# Patient Record
Sex: Female | Born: 1955 | Race: White | Hispanic: No | Marital: Married | Smoking: Never smoker
Health system: Southern US, Community
[De-identification: ages and names within clinical notes are randomized; demographics above are authoritative.]

## PROBLEM LIST (undated history)

## (undated) DIAGNOSIS — F32A Depression, unspecified: Secondary | ICD-10-CM

## (undated) DIAGNOSIS — Z9221 Personal history of antineoplastic chemotherapy: Secondary | ICD-10-CM

## (undated) DIAGNOSIS — E785 Hyperlipidemia, unspecified: Secondary | ICD-10-CM

## (undated) DIAGNOSIS — C50919 Malignant neoplasm of unspecified site of unspecified female breast: Secondary | ICD-10-CM

## (undated) DIAGNOSIS — Z9889 Other specified postprocedural states: Secondary | ICD-10-CM

## (undated) DIAGNOSIS — Z923 Personal history of irradiation: Secondary | ICD-10-CM

## (undated) DIAGNOSIS — I10 Essential (primary) hypertension: Secondary | ICD-10-CM

## (undated) DIAGNOSIS — F419 Anxiety disorder, unspecified: Secondary | ICD-10-CM

## (undated) HISTORY — DX: Essential (primary) hypertension: I10

## (undated) HISTORY — DX: Malignant neoplasm of unspecified site of unspecified female breast: C50.919

## (undated) HISTORY — PX: WISDOM TOOTH EXTRACTION: SHX21

## (undated) HISTORY — DX: Other specified postprocedural states: Z98.890

## (undated) HISTORY — DX: Hyperlipidemia, unspecified: E78.5

## (undated) HISTORY — PX: BREAST BIOPSY: SHX20

---

## 2001-08-22 HISTORY — PX: AUGMENTATION MAMMAPLASTY: SUR837

## 2001-08-22 HISTORY — PX: BREAST ENHANCEMENT SURGERY: SHX7

## 2002-12-16 ENCOUNTER — Other Ambulatory Visit: Admission: RE | Admit: 2002-12-16 | Discharge: 2002-12-16 | Payer: Self-pay | Admitting: Obstetrics and Gynecology

## 2004-02-24 ENCOUNTER — Other Ambulatory Visit: Admission: RE | Admit: 2004-02-24 | Discharge: 2004-02-24 | Payer: Self-pay | Admitting: Obstetrics and Gynecology

## 2004-05-20 ENCOUNTER — Encounter: Admission: RE | Admit: 2004-05-20 | Discharge: 2004-05-20 | Payer: Self-pay | Admitting: Obstetrics and Gynecology

## 2005-08-24 ENCOUNTER — Encounter: Admission: RE | Admit: 2005-08-24 | Discharge: 2005-08-24 | Payer: Self-pay | Admitting: Obstetrics and Gynecology

## 2005-09-14 ENCOUNTER — Encounter: Admission: RE | Admit: 2005-09-14 | Discharge: 2005-09-14 | Payer: Self-pay | Admitting: Obstetrics and Gynecology

## 2005-09-19 ENCOUNTER — Encounter (INDEPENDENT_AMBULATORY_CARE_PROVIDER_SITE_OTHER): Payer: Self-pay | Admitting: Specialist

## 2005-09-19 ENCOUNTER — Encounter: Admission: RE | Admit: 2005-09-19 | Discharge: 2005-09-19 | Payer: Self-pay | Admitting: Obstetrics and Gynecology

## 2005-09-20 ENCOUNTER — Other Ambulatory Visit: Admission: RE | Admit: 2005-09-20 | Discharge: 2005-09-20 | Payer: Self-pay | Admitting: Obstetrics & Gynecology

## 2005-09-22 DIAGNOSIS — Z9889 Other specified postprocedural states: Secondary | ICD-10-CM

## 2005-09-22 HISTORY — DX: Other specified postprocedural states: Z98.890

## 2005-09-22 HISTORY — PX: BREAST BIOPSY: SHX20

## 2005-10-11 ENCOUNTER — Encounter: Admission: RE | Admit: 2005-10-11 | Discharge: 2005-10-11 | Payer: Self-pay | Admitting: General Surgery

## 2005-10-11 ENCOUNTER — Ambulatory Visit (HOSPITAL_BASED_OUTPATIENT_CLINIC_OR_DEPARTMENT_OTHER): Admission: RE | Admit: 2005-10-11 | Discharge: 2005-10-11 | Payer: Self-pay | Admitting: General Surgery

## 2005-10-11 ENCOUNTER — Encounter (INDEPENDENT_AMBULATORY_CARE_PROVIDER_SITE_OTHER): Payer: Self-pay | Admitting: *Deleted

## 2006-08-22 DIAGNOSIS — Z923 Personal history of irradiation: Secondary | ICD-10-CM

## 2006-08-22 DIAGNOSIS — Z9221 Personal history of antineoplastic chemotherapy: Secondary | ICD-10-CM

## 2006-08-22 DIAGNOSIS — C50919 Malignant neoplasm of unspecified site of unspecified female breast: Secondary | ICD-10-CM

## 2006-08-22 HISTORY — DX: Personal history of irradiation: Z92.3

## 2006-08-22 HISTORY — PX: BREAST LUMPECTOMY: SHX2

## 2006-08-22 HISTORY — DX: Malignant neoplasm of unspecified site of unspecified female breast: C50.919

## 2006-08-22 HISTORY — DX: Personal history of antineoplastic chemotherapy: Z92.21

## 2006-08-28 ENCOUNTER — Encounter: Admission: RE | Admit: 2006-08-28 | Discharge: 2006-08-28 | Payer: Self-pay | Admitting: Obstetrics and Gynecology

## 2006-09-06 ENCOUNTER — Encounter: Admission: RE | Admit: 2006-09-06 | Discharge: 2006-09-06 | Payer: Self-pay | Admitting: Obstetrics and Gynecology

## 2006-09-06 ENCOUNTER — Encounter (INDEPENDENT_AMBULATORY_CARE_PROVIDER_SITE_OTHER): Payer: Self-pay | Admitting: Diagnostic Radiology

## 2006-09-14 ENCOUNTER — Encounter: Admission: RE | Admit: 2006-09-14 | Discharge: 2006-09-14 | Payer: Self-pay | Admitting: Obstetrics and Gynecology

## 2006-09-27 ENCOUNTER — Encounter: Admission: RE | Admit: 2006-09-27 | Discharge: 2006-09-27 | Payer: Self-pay | Admitting: General Surgery

## 2006-09-29 ENCOUNTER — Encounter: Admission: RE | Admit: 2006-09-29 | Discharge: 2006-09-29 | Payer: Self-pay | Admitting: General Surgery

## 2006-09-29 ENCOUNTER — Ambulatory Visit (HOSPITAL_BASED_OUTPATIENT_CLINIC_OR_DEPARTMENT_OTHER): Admission: RE | Admit: 2006-09-29 | Discharge: 2006-09-29 | Payer: Self-pay | Admitting: General Surgery

## 2006-09-29 ENCOUNTER — Encounter (INDEPENDENT_AMBULATORY_CARE_PROVIDER_SITE_OTHER): Payer: Self-pay | Admitting: Specialist

## 2006-10-06 ENCOUNTER — Ambulatory Visit: Payer: Self-pay | Admitting: Oncology

## 2006-10-13 ENCOUNTER — Ambulatory Visit (HOSPITAL_BASED_OUTPATIENT_CLINIC_OR_DEPARTMENT_OTHER): Admission: RE | Admit: 2006-10-13 | Discharge: 2006-10-14 | Payer: Self-pay | Admitting: General Surgery

## 2006-10-13 ENCOUNTER — Encounter (INDEPENDENT_AMBULATORY_CARE_PROVIDER_SITE_OTHER): Payer: Self-pay | Admitting: Specialist

## 2006-10-18 LAB — CBC WITH DIFFERENTIAL/PLATELET
Basophils Absolute: 0 10*3/uL (ref 0.0–0.1)
Eosinophils Absolute: 0.3 10*3/uL (ref 0.0–0.5)
HCT: 38.5 % (ref 34.8–46.6)
LYMPH%: 24.8 % (ref 14.0–48.0)
MCV: 92.7 fL (ref 81.0–101.0)
MONO#: 0.8 10*3/uL (ref 0.1–0.9)
MONO%: 8.8 % (ref 0.0–13.0)
NEUT%: 62.7 % (ref 39.6–76.8)
Platelets: 328 10*3/uL (ref 145–400)

## 2006-10-18 LAB — COMPREHENSIVE METABOLIC PANEL
Alkaline Phosphatase: 71 U/L (ref 39–117)
BUN: 17 mg/dL (ref 6–23)
CO2: 24 mEq/L (ref 19–32)
Creatinine, Ser: 0.7 mg/dL (ref 0.40–1.20)
Glucose, Bld: 87 mg/dL (ref 70–99)
Sodium: 138 mEq/L (ref 135–145)
Total Bilirubin: 0.5 mg/dL (ref 0.3–1.2)
Total Protein: 6.7 g/dL (ref 6.0–8.3)

## 2006-10-18 LAB — CANCER ANTIGEN 27.29: CA 27.29: 21 U/mL (ref 0–39)

## 2006-10-18 LAB — LACTATE DEHYDROGENASE: LDH: 121 U/L (ref 94–250)

## 2006-10-23 ENCOUNTER — Ambulatory Visit (HOSPITAL_COMMUNITY): Admission: RE | Admit: 2006-10-23 | Discharge: 2006-10-23 | Payer: Self-pay | Admitting: Oncology

## 2006-11-06 LAB — COMPREHENSIVE METABOLIC PANEL
BUN: 19 mg/dL (ref 6–23)
CO2: 23 mEq/L (ref 19–32)
Calcium: 9.9 mg/dL (ref 8.4–10.5)
Chloride: 104 mEq/L (ref 96–112)
Creatinine, Ser: 0.91 mg/dL (ref 0.40–1.20)
Total Bilirubin: 0.5 mg/dL (ref 0.3–1.2)

## 2006-11-06 LAB — CANCER ANTIGEN 27.29: CA 27.29: 20 U/mL (ref 0–39)

## 2006-11-06 LAB — CBC WITH DIFFERENTIAL/PLATELET
Basophils Absolute: 0 10*3/uL (ref 0.0–0.1)
HCT: 40 % (ref 34.8–46.6)
HGB: 13.8 g/dL (ref 11.6–15.9)
LYMPH%: 31.5 % (ref 14.0–48.0)
MCH: 32.2 pg (ref 26.0–34.0)
MCHC: 34.5 g/dL (ref 32.0–36.0)
MONO#: 0.5 10*3/uL (ref 0.1–0.9)
NEUT%: 55.9 % (ref 39.6–76.8)
Platelets: 338 10*3/uL (ref 145–400)
WBC: 6.4 10*3/uL (ref 3.9–10.0)
lymph#: 2 10*3/uL (ref 0.9–3.3)

## 2006-11-06 LAB — LACTATE DEHYDROGENASE: LDH: 119 U/L (ref 94–250)

## 2006-11-15 LAB — CBC WITH DIFFERENTIAL/PLATELET
Eosinophils Absolute: 0.1 10*3/uL (ref 0.0–0.5)
HGB: 12.8 g/dL (ref 11.6–15.9)
MONO#: 1.2 10*3/uL — ABNORMAL HIGH (ref 0.1–0.9)
MONO%: 4.2 % (ref 0.0–13.0)
NEUT#: 24.1 10*3/uL — ABNORMAL HIGH (ref 1.5–6.5)
RBC: 3.92 10*6/uL (ref 3.70–5.32)
RDW: 13.4 % (ref 11.3–14.5)
WBC: 28.6 10*3/uL — ABNORMAL HIGH (ref 3.9–10.0)
lymph#: 3.2 10*3/uL (ref 0.9–3.3)

## 2006-11-22 ENCOUNTER — Ambulatory Visit: Payer: Self-pay | Admitting: Oncology

## 2006-11-27 LAB — CBC WITH DIFFERENTIAL/PLATELET
Basophils Absolute: 0 10*3/uL (ref 0.0–0.1)
Eosinophils Absolute: 0 10*3/uL (ref 0.0–0.5)
HCT: 35.5 % (ref 34.8–46.6)
HGB: 12.6 g/dL (ref 11.6–15.9)
LYMPH%: 5.5 % — ABNORMAL LOW (ref 14.0–48.0)
MONO#: 0 10*3/uL — ABNORMAL LOW (ref 0.1–0.9)
NEUT#: 11.7 10*3/uL — ABNORMAL HIGH (ref 1.5–6.5)
Platelets: 381 10*3/uL (ref 145–400)
RBC: 3.87 10*6/uL (ref 3.70–5.32)
WBC: 12.4 10*3/uL — ABNORMAL HIGH (ref 3.9–10.0)

## 2006-12-05 LAB — CBC WITH DIFFERENTIAL/PLATELET
BASO%: 3.6 % — ABNORMAL HIGH (ref 0.0–2.0)
EOS%: 0.8 % (ref 0.0–7.0)
LYMPH%: 14.8 % (ref 14.0–48.0)
MCHC: 36.2 g/dL — ABNORMAL HIGH (ref 32.0–36.0)
MCV: 89.8 fL (ref 81.0–101.0)
MONO%: 9.5 % (ref 0.0–13.0)
Platelets: 271 10*3/uL (ref 145–400)
RBC: 3.83 10*6/uL (ref 3.70–5.32)

## 2006-12-16 ENCOUNTER — Emergency Department (HOSPITAL_COMMUNITY): Admission: EM | Admit: 2006-12-16 | Discharge: 2006-12-16 | Payer: Self-pay | Admitting: Emergency Medicine

## 2006-12-18 LAB — CBC WITH DIFFERENTIAL/PLATELET
BASO%: 0.1 % (ref 0.0–2.0)
LYMPH%: 9.7 % — ABNORMAL LOW (ref 14.0–48.0)
MCHC: 35.7 g/dL (ref 32.0–36.0)
MONO#: 0.2 10*3/uL (ref 0.1–0.9)
Platelets: 287 10*3/uL (ref 145–400)
RBC: 3.49 10*6/uL — ABNORMAL LOW (ref 3.70–5.32)
RDW: 14.1 % (ref 11.3–14.5)
WBC: 8 10*3/uL (ref 3.9–10.0)
lymph#: 0.8 10*3/uL — ABNORMAL LOW (ref 0.9–3.3)

## 2006-12-26 LAB — CBC WITH DIFFERENTIAL/PLATELET
BASO%: 0.2 % (ref 0.0–2.0)
HCT: 33.3 % — ABNORMAL LOW (ref 34.8–46.6)
MCHC: 35.4 g/dL (ref 32.0–36.0)
MONO#: 1.3 10*3/uL — ABNORMAL HIGH (ref 0.1–0.9)
NEUT#: 15.7 10*3/uL — ABNORMAL HIGH (ref 1.5–6.5)
NEUT%: 80.1 % — ABNORMAL HIGH (ref 39.6–76.8)
WBC: 19.6 10*3/uL — ABNORMAL HIGH (ref 3.9–10.0)
lymph#: 2 10*3/uL (ref 0.9–3.3)

## 2007-01-19 ENCOUNTER — Ambulatory Visit: Payer: Self-pay | Admitting: Oncology

## 2007-01-23 LAB — CBC WITH DIFFERENTIAL/PLATELET
Basophils Absolute: 0 10*3/uL (ref 0.0–0.1)
HCT: 36.3 % (ref 34.8–46.6)
HGB: 12.8 g/dL (ref 11.6–15.9)
MCH: 32.3 pg (ref 26.0–34.0)
MONO#: 0.7 10*3/uL (ref 0.1–0.9)
NEUT%: 86.1 % — ABNORMAL HIGH (ref 39.6–76.8)
WBC: 10.8 10*3/uL — ABNORMAL HIGH (ref 3.9–10.0)
lymph#: 0.8 10*3/uL — ABNORMAL LOW (ref 0.9–3.3)

## 2007-01-23 LAB — COMPREHENSIVE METABOLIC PANEL
BUN: 19 mg/dL (ref 6–23)
CO2: 19 mEq/L (ref 19–32)
Calcium: 10.5 mg/dL (ref 8.4–10.5)
Chloride: 104 mEq/L (ref 96–112)
Creatinine, Ser: 0.7 mg/dL (ref 0.40–1.20)
Glucose, Bld: 124 mg/dL — ABNORMAL HIGH (ref 70–99)

## 2007-01-29 ENCOUNTER — Ambulatory Visit: Admission: RE | Admit: 2007-01-29 | Discharge: 2007-04-12 | Payer: Self-pay | Admitting: Radiation Oncology

## 2007-01-30 LAB — CBC WITH DIFFERENTIAL/PLATELET
Basophils Absolute: 0.1 10*3/uL (ref 0.0–0.1)
Eosinophils Absolute: 1.1 10*3/uL — ABNORMAL HIGH (ref 0.0–0.5)
HGB: 12.4 g/dL (ref 11.6–15.9)
LYMPH%: 13.1 % — ABNORMAL LOW (ref 14.0–48.0)
MCV: 94.6 fL (ref 81.0–101.0)
MONO#: 0.5 10*3/uL (ref 0.1–0.9)
MONO%: 3.8 % (ref 0.0–13.0)
NEUT#: 10.8 10*3/uL — ABNORMAL HIGH (ref 1.5–6.5)
Platelets: 198 10*3/uL (ref 145–400)
RBC: 3.75 10*6/uL (ref 3.70–5.32)
RDW: 14.8 % — ABNORMAL HIGH (ref 11.3–14.5)
WBC: 14.4 10*3/uL — ABNORMAL HIGH (ref 3.9–10.0)

## 2007-02-26 LAB — COMPREHENSIVE METABOLIC PANEL
Alkaline Phosphatase: 74 U/L (ref 39–117)
BUN: 18 mg/dL (ref 6–23)
CO2: 23 mEq/L (ref 19–32)
Creatinine, Ser: 0.97 mg/dL (ref 0.40–1.20)
Glucose, Bld: 96 mg/dL (ref 70–99)
Total Bilirubin: 0.4 mg/dL (ref 0.3–1.2)
Total Protein: 7 g/dL (ref 6.0–8.3)

## 2007-02-26 LAB — CBC WITH DIFFERENTIAL/PLATELET
Basophils Absolute: 0 10*3/uL (ref 0.0–0.1)
Eosinophils Absolute: 0.3 10*3/uL (ref 0.0–0.5)
HCT: 35.6 % (ref 34.8–46.6)
LYMPH%: 20 % (ref 14.0–48.0)
MCV: 94.3 fL (ref 81.0–101.0)
MONO#: 0.5 10*3/uL (ref 0.1–0.9)
MONO%: 8.9 % (ref 0.0–13.0)
NEUT#: 3.8 10*3/uL (ref 1.5–6.5)
NEUT%: 65.9 % (ref 39.6–76.8)
Platelets: 297 10*3/uL (ref 145–400)
WBC: 5.7 10*3/uL (ref 3.9–10.0)

## 2007-02-26 LAB — CANCER ANTIGEN 27.29: CA 27.29: 19 U/mL (ref 0–39)

## 2007-02-26 LAB — LACTATE DEHYDROGENASE: LDH: 148 U/L (ref 94–250)

## 2007-04-11 ENCOUNTER — Other Ambulatory Visit: Admission: RE | Admit: 2007-04-11 | Discharge: 2007-04-11 | Payer: Self-pay | Admitting: Obstetrics & Gynecology

## 2007-04-19 ENCOUNTER — Encounter: Admission: RE | Admit: 2007-04-19 | Discharge: 2007-04-19 | Payer: Self-pay | Admitting: Oncology

## 2007-04-20 ENCOUNTER — Ambulatory Visit: Payer: Self-pay | Admitting: Oncology

## 2007-04-26 LAB — CANCER ANTIGEN 27.29: CA 27.29: 17 U/mL (ref 0–39)

## 2007-04-26 LAB — CBC WITH DIFFERENTIAL/PLATELET
Basophils Absolute: 0 10*3/uL (ref 0.0–0.1)
EOS%: 2.6 % (ref 0.0–7.0)
HGB: 13.6 g/dL (ref 11.6–15.9)
MCH: 32.1 pg (ref 26.0–34.0)
MONO#: 0.6 10*3/uL (ref 0.1–0.9)
NEUT#: 3.6 10*3/uL (ref 1.5–6.5)
RDW: 12.9 % (ref 11.3–14.5)
WBC: 5.6 10*3/uL (ref 3.9–10.0)
lymph#: 1.3 10*3/uL (ref 0.9–3.3)

## 2007-04-26 LAB — COMPREHENSIVE METABOLIC PANEL
Albumin: 4.3 g/dL (ref 3.5–5.2)
Alkaline Phosphatase: 78 U/L (ref 39–117)
BUN: 14 mg/dL (ref 6–23)
Glucose, Bld: 85 mg/dL (ref 70–99)
Potassium: 3.9 mEq/L (ref 3.5–5.3)
Total Bilirubin: 0.4 mg/dL (ref 0.3–1.2)

## 2007-05-04 LAB — ESTRADIOL, ULTRA SENS: Estradiol, Ultra Sensitive: 18 pg/mL

## 2007-07-17 ENCOUNTER — Ambulatory Visit: Payer: Self-pay | Admitting: Oncology

## 2007-07-23 LAB — CBC WITH DIFFERENTIAL/PLATELET
BASO%: 0.2 % (ref 0.0–2.0)
Basophils Absolute: 0 10*3/uL (ref 0.0–0.1)
EOS%: 2.1 % (ref 0.0–7.0)
HGB: 13.3 g/dL (ref 11.6–15.9)
MCH: 32.4 pg (ref 26.0–34.0)
RBC: 4.11 10*6/uL (ref 3.70–5.32)
RDW: 12.7 % (ref 11.3–14.5)
lymph#: 1.4 10*3/uL (ref 0.9–3.3)

## 2007-07-23 LAB — COMPREHENSIVE METABOLIC PANEL
Albumin: 4.3 g/dL (ref 3.5–5.2)
Alkaline Phosphatase: 69 U/L (ref 39–117)
BUN: 19 mg/dL (ref 6–23)
CO2: 22 mEq/L (ref 19–32)
Glucose, Bld: 117 mg/dL — ABNORMAL HIGH (ref 70–99)
Potassium: 4.1 mEq/L (ref 3.5–5.3)
Total Bilirubin: 0.3 mg/dL (ref 0.3–1.2)
Total Protein: 7 g/dL (ref 6.0–8.3)

## 2007-07-23 LAB — LACTATE DEHYDROGENASE: LDH: 127 U/L (ref 94–250)

## 2007-08-23 HISTORY — PX: REDUCTION MAMMAPLASTY: SUR839

## 2007-08-31 ENCOUNTER — Encounter: Admission: RE | Admit: 2007-08-31 | Discharge: 2007-08-31 | Payer: Self-pay | Admitting: Oncology

## 2007-12-18 IMAGING — CT CT ABDOMEN W/ CM
2 of 5 series · 16 of 46 positions shown, 18 images · IV contrast (omnipaque)
Comparison: none

CLINICAL DATA: Breast cancer.  Status post left lumpectomy.
CHEST CT WITH CONTRAST:
TECHNIQUE: Multidetector CT imaging of the chest was performed following the standard protocol during bolus administration of intravenous contrast.
Contrast:  125 cc Omnipaque 300.
TECHNIQUE: Multidetector CT imaging of the abdomen was performed following the standard protocol during bolus administration of intravenous contrast.
TECHNIQUE: Multidetector CT imaging of the pelvis was performed following the standard protocol during bolus administration of intravenous contrast.

[Series 2: cap 5.0 b40f · axial · 0.68mm/px · z∈[-639,-79]mm · 13 of 128 slices shown, 15 images]
[im 8/128  soft-tissue]
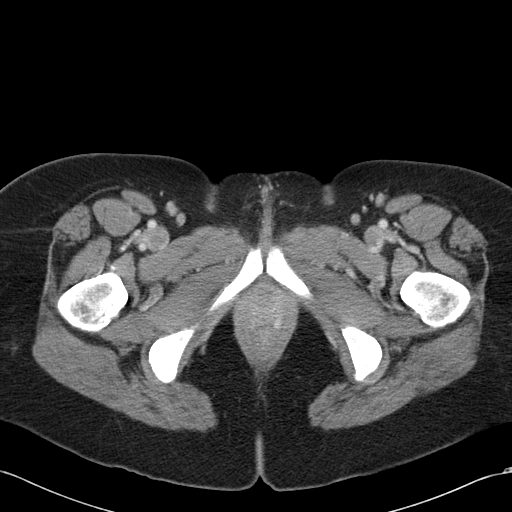
[im 8/128  bone]
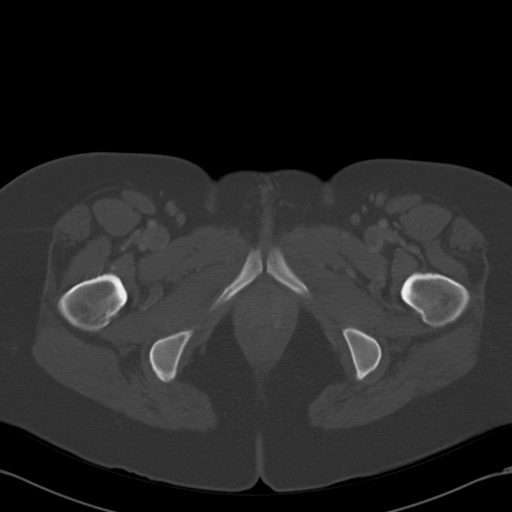
[im 15/128  soft-tissue]
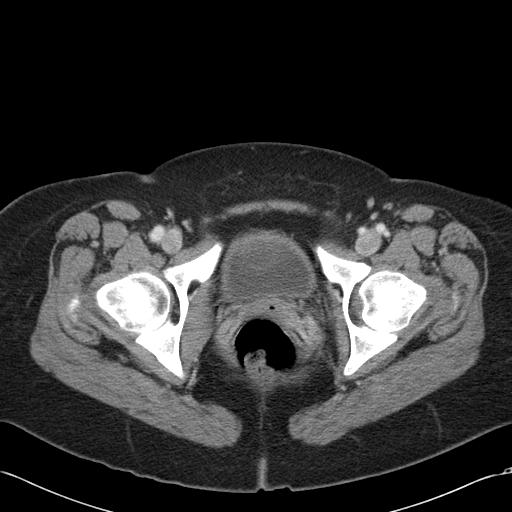
[im 29/128  soft-tissue]
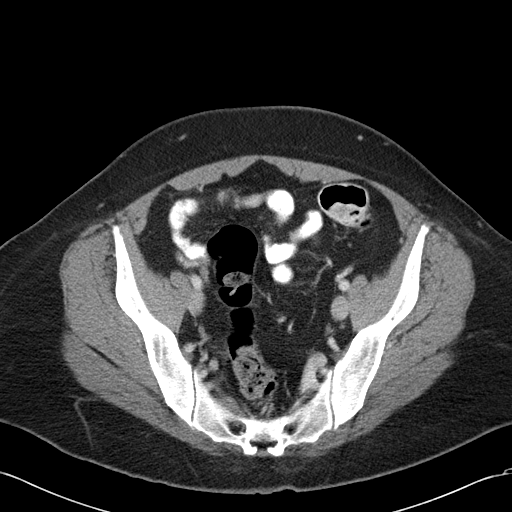
[im 36/128  soft-tissue]
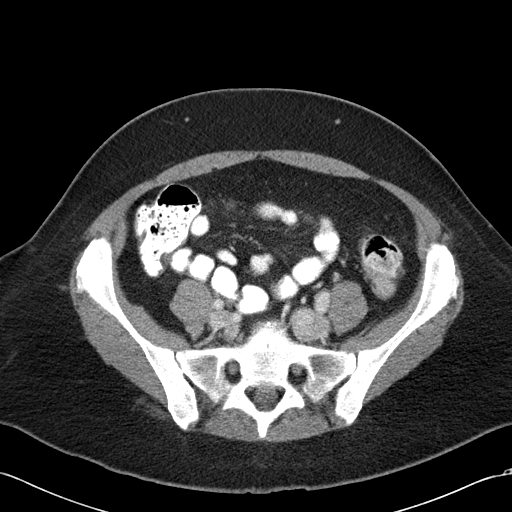
[im 43/128  soft-tissue]
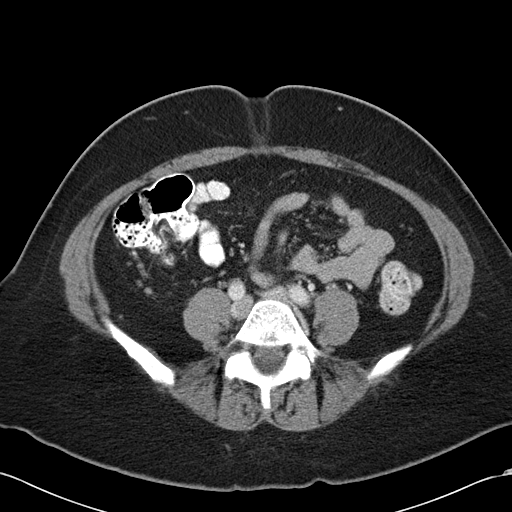
[im 57/128  soft-tissue]
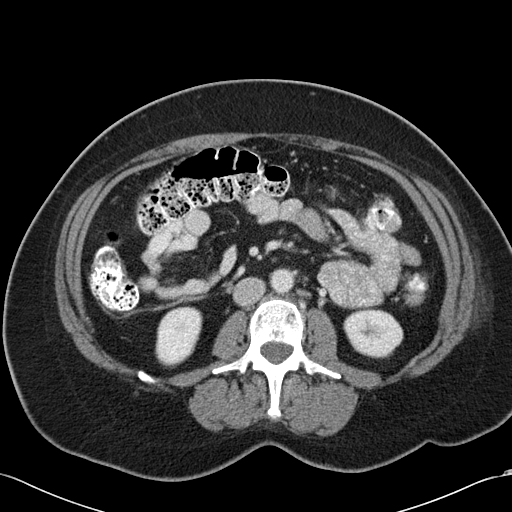
[im 64/128  soft-tissue]
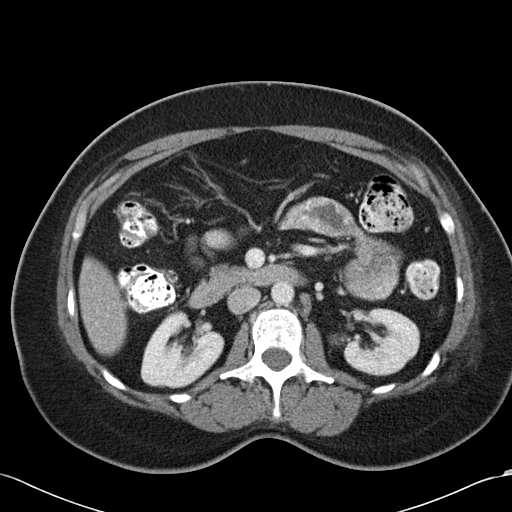
[im 71/128  soft-tissue]
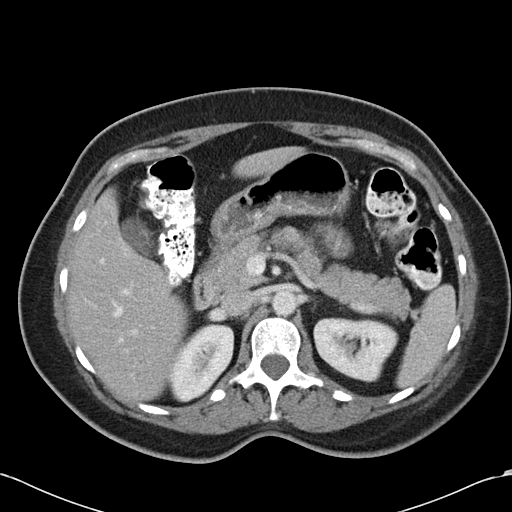
[im 85/128  soft-tissue]
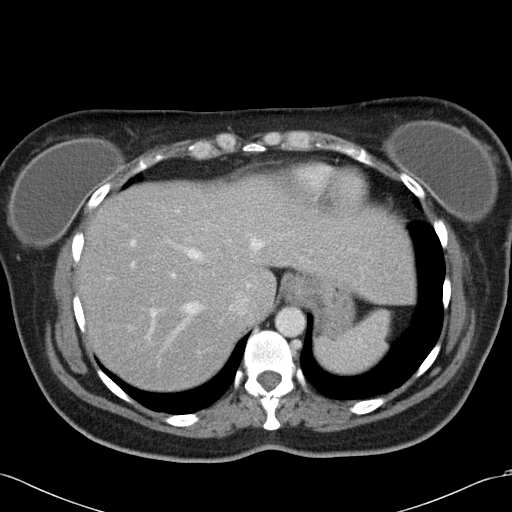
[im 85/128  bone]
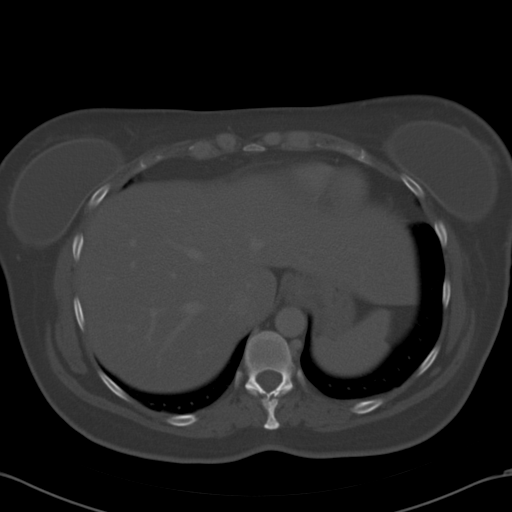
[im 92/128  soft-tissue]
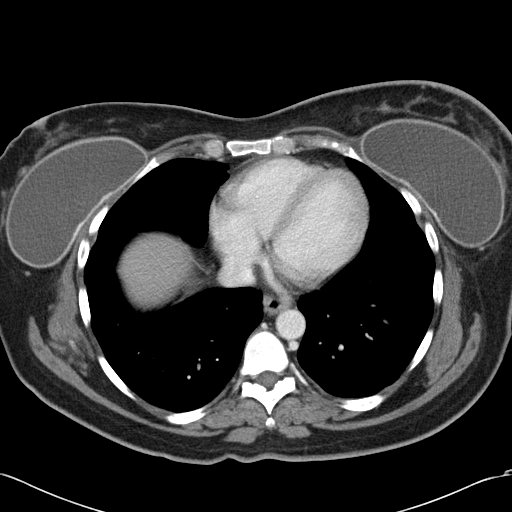
[im 99/128  soft-tissue]
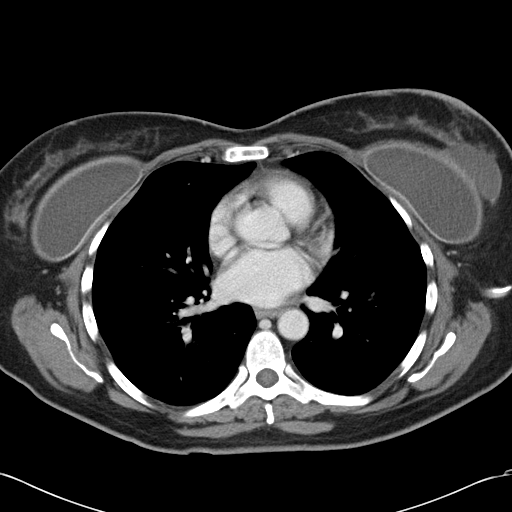
[im 113/128  soft-tissue]
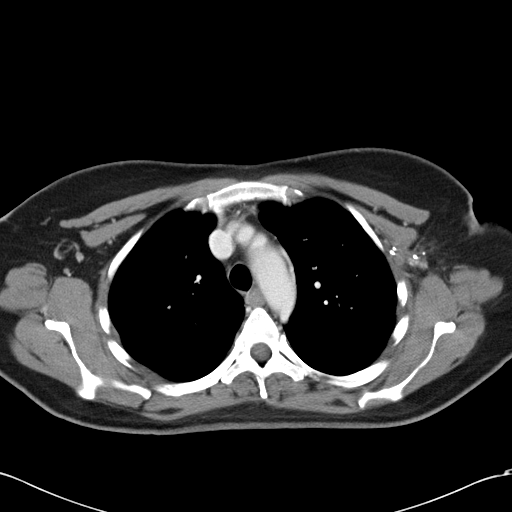
[im 120/128  soft-tissue]
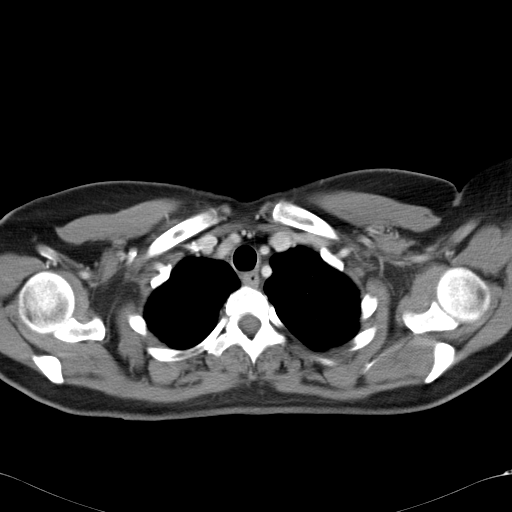

[Series 602: <mpr thick range> · coronal · 1.24mm/px · 3 of 69 slices shown]
[im 23/69  soft-tissue]
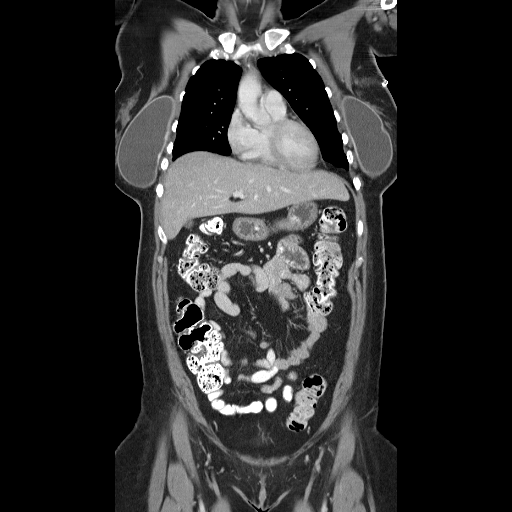
[im 31/69  soft-tissue]
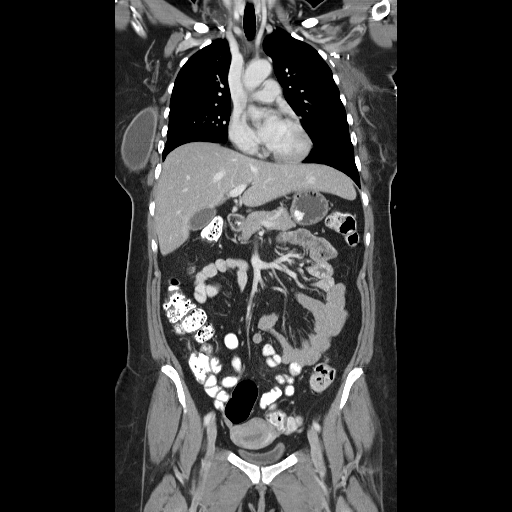
[im 38/69  soft-tissue]
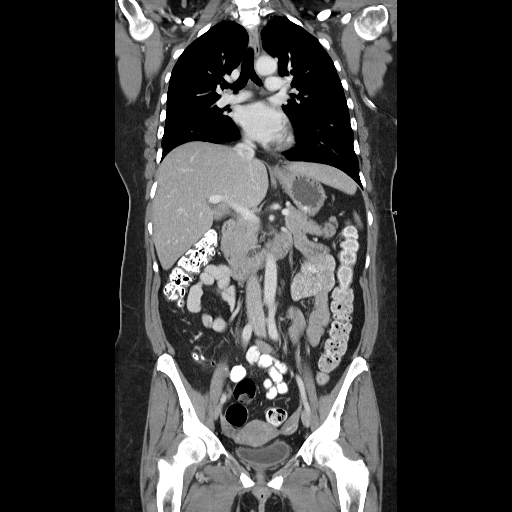

[16 of 46 positions shown; findings below may reference images not displayed]

FINDINGS: The patient has bilateral breast implants.  Within the left breast, there is a fluid density mass which measures 5.4 x 3.4 cm (image #27).  This most likely represents a postoperative seroma or hematoma.  A surgical drainage catheter is identified within the posterior aspect of the left chest wall. 
No enlarged axillary lymph nodes are identified.  
There is no mediastinal or hilar lymphadenopathy.  
No pericardial or pleural effusion is noted. 
There is no suspicious pulmonary nodule or mass. 
Review of bone windows shows no lytic or sclerotic lesion.
IMPRESSION: 1. Postoperative changes within the left breast, as described above.
2. No evidence for metastasis to the mediastinum,  hilar region or lungs. 
ABDOMEN CT WITH CONTRAST:
FINDINGS: The liver is normal in attenuation and morphology.
The spleen is normal.  
The pancreas is normal.
Right and left adrenal glands are normal.
Right kidney is normal.  The left kidney is normal.  
No enlarged retroperitoneal or small bowel mesenteric adenopathy is noted.  There is no free fluid. 
The appendix is identified within the right lower quadrant and appears normal.  The visualized bowel loops are normal in course and caliber.  No lytic or sclerotic lesions are identified.
IMPRESSION: No evidence for upper abdominal metastatic disease.
PELVIS CT WITH CONTRAST:
FINDINGS: There is no enlarged inguinal or pelvic lymphadenopathy.  There is no free fluid.  The uterus and adnexal structures are normal in appearance.  Within the left ovary, there is a peripherally enhancing mass with central low density, which likely represents a corpus luteal cyst.  
No pelvic mass is identified.  
Review of bone windows shows no lytic or sclerotic lesions.
IMPRESSION: No evidence for pelvic metastasis.

## 2007-12-19 ENCOUNTER — Ambulatory Visit: Payer: Self-pay | Admitting: Oncology

## 2007-12-21 LAB — CBC WITH DIFFERENTIAL/PLATELET
BASO%: 0.7 % (ref 0.0–2.0)
HCT: 36.9 % (ref 34.8–46.6)
LYMPH%: 29.6 % (ref 14.0–48.0)
MCH: 32.9 pg (ref 26.0–34.0)
MCHC: 35.7 g/dL (ref 32.0–36.0)
MONO#: 0.5 10*3/uL (ref 0.1–0.9)
NEUT%: 59.9 % (ref 39.6–76.8)
Platelets: 260 10*3/uL (ref 145–400)
WBC: 6.7 10*3/uL (ref 3.9–10.0)

## 2007-12-24 LAB — VITAMIN D 25 HYDROXY (VIT D DEFICIENCY, FRACTURES): Vit D, 25-Hydroxy: 34 ng/mL (ref 30–89)

## 2007-12-24 LAB — COMPREHENSIVE METABOLIC PANEL
ALT: 26 U/L (ref 0–35)
CO2: 24 mEq/L (ref 19–32)
Creatinine, Ser: 0.72 mg/dL (ref 0.40–1.20)
Glucose, Bld: 95 mg/dL (ref 70–99)
Total Bilirubin: 0.3 mg/dL (ref 0.3–1.2)

## 2007-12-24 LAB — LACTATE DEHYDROGENASE: LDH: 128 U/L (ref 94–250)

## 2007-12-24 LAB — CANCER ANTIGEN 27.29: CA 27.29: 20 U/mL (ref 0–39)

## 2007-12-24 LAB — FOLLICLE STIMULATING HORMONE: FSH: 25.5 m[IU]/mL

## 2007-12-27 LAB — ESTRADIOL, ULTRA SENS: Estradiol, Ultra Sensitive: <2 pg/mL

## 2008-04-17 ENCOUNTER — Other Ambulatory Visit: Admission: RE | Admit: 2008-04-17 | Discharge: 2008-04-17 | Payer: Self-pay | Admitting: Obstetrics & Gynecology

## 2008-07-15 ENCOUNTER — Ambulatory Visit: Payer: Self-pay | Admitting: Oncology

## 2008-07-15 LAB — CBC WITH DIFFERENTIAL/PLATELET
BASO%: 0.4 % (ref 0.0–2.0)
Basophils Absolute: 0 10*3/uL (ref 0.0–0.1)
EOS%: 4.1 % (ref 0.0–7.0)
HGB: 13.1 g/dL (ref 11.6–15.9)
MCH: 32.3 pg (ref 26.0–34.0)
MONO%: 8.9 % (ref 0.0–13.0)
RBC: 4.04 10*6/uL (ref 3.70–5.32)
RDW: 12.5 % (ref 11.3–14.5)
lymph#: 2 10*3/uL (ref 0.9–3.3)

## 2008-07-16 LAB — COMPREHENSIVE METABOLIC PANEL
ALT: 40 U/L — ABNORMAL HIGH (ref 0–35)
AST: 28 U/L (ref 0–37)
Albumin: 4.1 g/dL (ref 3.5–5.2)
Alkaline Phosphatase: 67 U/L (ref 39–117)
BUN: 13 mg/dL (ref 6–23)
Calcium: 9.4 mg/dL (ref 8.4–10.5)
Chloride: 110 mEq/L (ref 96–112)
Potassium: 4.2 mEq/L (ref 3.5–5.3)
Sodium: 143 mEq/L (ref 135–145)
Total Protein: 6.9 g/dL (ref 6.0–8.3)

## 2008-09-01 ENCOUNTER — Encounter: Admission: RE | Admit: 2008-09-01 | Discharge: 2008-09-01 | Payer: Self-pay | Admitting: Oncology

## 2009-01-09 ENCOUNTER — Ambulatory Visit: Payer: Self-pay | Admitting: Oncology

## 2009-01-13 LAB — CBC WITH DIFFERENTIAL/PLATELET
BASO%: 0.4 % (ref 0.0–2.0)
LYMPH%: 29.3 % (ref 14.0–49.7)
MCH: 32.3 pg (ref 25.1–34.0)
MCHC: 34.3 g/dL (ref 31.5–36.0)
MCV: 93.9 fL (ref 79.5–101.0)
MONO%: 5.9 % (ref 0.0–14.0)
NEUT%: 62 % (ref 38.4–76.8)
Platelets: 267 10*3/uL (ref 145–400)
RBC: 4.22 10*6/uL (ref 3.70–5.45)

## 2009-01-13 LAB — COMPREHENSIVE METABOLIC PANEL
ALT: 49 U/L — ABNORMAL HIGH (ref 0–35)
Alkaline Phosphatase: 65 U/L (ref 39–117)
Creatinine, Ser: 0.92 mg/dL (ref 0.40–1.20)
Sodium: 138 mEq/L (ref 135–145)
Total Bilirubin: 0.7 mg/dL (ref 0.3–1.2)
Total Protein: 7.6 g/dL (ref 6.0–8.3)

## 2009-01-14 LAB — CANCER ANTIGEN 27.29: CA 27.29: 17 U/mL (ref 0–39)

## 2009-01-14 LAB — VITAMIN D 25 HYDROXY (VIT D DEFICIENCY, FRACTURES): Vit D, 25-Hydroxy: 46 ng/mL (ref 30–89)

## 2009-01-20 HISTORY — PX: BILATERAL SALPINGOOPHORECTOMY: SHX1223

## 2009-01-20 LAB — ESTRADIOL, ULTRA SENS

## 2009-02-16 ENCOUNTER — Encounter: Payer: Self-pay | Admitting: Obstetrics & Gynecology

## 2009-02-16 ENCOUNTER — Ambulatory Visit (HOSPITAL_COMMUNITY): Admission: RE | Admit: 2009-02-16 | Discharge: 2009-02-16 | Payer: Self-pay | Admitting: Obstetrics & Gynecology

## 2009-04-20 ENCOUNTER — Encounter: Admission: RE | Admit: 2009-04-20 | Discharge: 2009-04-20 | Payer: Self-pay | Admitting: Oncology

## 2009-04-28 ENCOUNTER — Ambulatory Visit: Payer: Self-pay | Admitting: Oncology

## 2009-04-30 LAB — COMPREHENSIVE METABOLIC PANEL
ALT: 45 U/L — ABNORMAL HIGH (ref 0–35)
Albumin: 3.9 g/dL (ref 3.5–5.2)
CO2: 28 mEq/L (ref 19–32)
Calcium: 9.7 mg/dL (ref 8.4–10.5)
Chloride: 106 mEq/L (ref 96–112)
Creatinine, Ser: 0.73 mg/dL (ref 0.40–1.20)
Potassium: 3.6 mEq/L (ref 3.5–5.3)
Sodium: 139 mEq/L (ref 135–145)
Total Protein: 7 g/dL (ref 6.0–8.3)

## 2009-04-30 LAB — CBC WITH DIFFERENTIAL/PLATELET
BASO%: 0.4 % (ref 0.0–2.0)
HCT: 37.9 % (ref 34.8–46.6)
MCHC: 34.8 g/dL (ref 31.5–36.0)
MONO#: 0.5 10*3/uL (ref 0.1–0.9)
NEUT%: 56.9 % (ref 38.4–76.8)
WBC: 6.5 10*3/uL (ref 3.9–10.3)
lymph#: 2.1 10*3/uL (ref 0.9–3.3)

## 2009-04-30 LAB — LACTATE DEHYDROGENASE: LDH: 116 U/L (ref 94–250)

## 2009-06-22 ENCOUNTER — Ambulatory Visit: Payer: Self-pay | Admitting: Oncology

## 2009-06-24 LAB — HEPATIC FUNCTION PANEL
ALT: 51 U/L — ABNORMAL HIGH (ref 0–35)
Bilirubin, Direct: <0.1 mg/dL (ref 0.0–0.3)
Total Bilirubin: 0.3 mg/dL (ref 0.3–1.2)

## 2009-06-24 LAB — GAMMA GT: GGT: 93 U/L — ABNORMAL HIGH (ref 7–51)

## 2009-09-02 ENCOUNTER — Encounter: Admission: RE | Admit: 2009-09-02 | Discharge: 2009-09-02 | Payer: Self-pay | Admitting: Oncology

## 2009-10-26 ENCOUNTER — Ambulatory Visit: Payer: Self-pay | Admitting: Oncology

## 2009-10-28 LAB — CBC WITH DIFFERENTIAL/PLATELET
Basophils Absolute: 0 10*3/uL (ref 0.0–0.1)
Eosinophils Absolute: 0.2 10*3/uL (ref 0.0–0.5)
HGB: 13.6 g/dL (ref 11.6–15.9)
MONO#: 0.5 10*3/uL (ref 0.1–0.9)
NEUT#: 4.4 10*3/uL (ref 1.5–6.5)
RBC: 4.32 10*6/uL (ref 3.70–5.45)
RDW: 12.4 % (ref 11.2–14.5)
WBC: 7.5 10*3/uL (ref 3.9–10.3)
lymph#: 2.3 10*3/uL (ref 0.9–3.3)

## 2009-10-28 LAB — COMPREHENSIVE METABOLIC PANEL
Albumin: 4.7 g/dL (ref 3.5–5.2)
Alkaline Phosphatase: 102 U/L (ref 39–117)
BUN: 15 mg/dL (ref 6–23)
Calcium: 10.8 mg/dL — ABNORMAL HIGH (ref 8.4–10.5)
Chloride: 103 mEq/L (ref 96–112)
Glucose, Bld: 91 mg/dL (ref 70–99)
Potassium: 4.3 mEq/L (ref 3.5–5.3)
Sodium: 141 mEq/L (ref 135–145)
Total Protein: 7.5 g/dL (ref 6.0–8.3)

## 2009-10-28 LAB — GAMMA GT: GGT: 53 U/L — ABNORMAL HIGH (ref 7–51)

## 2009-10-28 LAB — VITAMIN D 25 HYDROXY (VIT D DEFICIENCY, FRACTURES): Vit D, 25-Hydroxy: 42 ng/mL (ref 30–89)

## 2010-05-03 ENCOUNTER — Ambulatory Visit (HOSPITAL_BASED_OUTPATIENT_CLINIC_OR_DEPARTMENT_OTHER): Payer: Self-pay | Admitting: Oncology

## 2010-09-08 ENCOUNTER — Encounter
Admission: RE | Admit: 2010-09-08 | Discharge: 2010-09-08 | Payer: Self-pay | Source: Home / Self Care | Attending: Oncology | Admitting: Oncology

## 2010-09-12 ENCOUNTER — Encounter: Payer: Self-pay | Admitting: Oncology

## 2010-09-12 ENCOUNTER — Encounter: Payer: Self-pay | Admitting: Obstetrics and Gynecology

## 2010-11-09 ENCOUNTER — Encounter: Payer: Self-pay | Admitting: Oncology

## 2010-11-09 DIAGNOSIS — C50419 Malignant neoplasm of upper-outer quadrant of unspecified female breast: Secondary | ICD-10-CM

## 2010-11-09 LAB — COMPREHENSIVE METABOLIC PANEL
ALT: 21 U/L (ref 0–35)
AST: 29 U/L (ref 0–37)
Albumin: 4.2 g/dL (ref 3.5–5.2)
BUN: 19 mg/dL (ref 6–23)
CO2: 29 mEq/L (ref 19–32)
Calcium: 9.8 mg/dL (ref 8.4–10.5)
Chloride: 102 mEq/L (ref 96–112)
Creatinine, Ser: 0.77 mg/dL (ref 0.40–1.20)
Potassium: 3.4 mEq/L — ABNORMAL LOW (ref 3.5–5.3)

## 2010-11-09 LAB — CBC WITH DIFFERENTIAL/PLATELET
Basophils Absolute: 0 10*3/uL (ref 0.0–0.1)
HCT: 39.6 % (ref 34.8–46.6)
HGB: 13.5 g/dL (ref 11.6–15.9)
MONO#: 0.6 10*3/uL (ref 0.1–0.9)
NEUT#: 5.5 10*3/uL (ref 1.5–6.5)
NEUT%: 63.9 % (ref 38.4–76.8)
RDW: 12.9 % (ref 11.2–14.5)
WBC: 8.5 10*3/uL (ref 3.9–10.3)
lymph#: 2.3 10*3/uL (ref 0.9–3.3)

## 2010-11-09 LAB — LACTATE DEHYDROGENASE: LDH: 143 U/L (ref 94–250)

## 2010-11-16 ENCOUNTER — Other Ambulatory Visit: Payer: Self-pay | Admitting: Oncology

## 2010-11-16 ENCOUNTER — Encounter (HOSPITAL_BASED_OUTPATIENT_CLINIC_OR_DEPARTMENT_OTHER): Payer: 59 | Admitting: Oncology

## 2010-11-16 DIAGNOSIS — C50419 Malignant neoplasm of upper-outer quadrant of unspecified female breast: Secondary | ICD-10-CM

## 2010-11-16 DIAGNOSIS — Z170 Estrogen receptor positive status [ER+]: Secondary | ICD-10-CM

## 2010-11-16 DIAGNOSIS — Z853 Personal history of malignant neoplasm of breast: Secondary | ICD-10-CM

## 2010-11-29 LAB — URINALYSIS, ROUTINE W REFLEX MICROSCOPIC
Bilirubin Urine: NEGATIVE
Nitrite: NEGATIVE
Specific Gravity, Urine: 1.025 (ref 1.005–1.030)
Urobilinogen, UA: 0.2 mg/dL (ref 0.0–1.0)

## 2010-11-29 LAB — CBC
Platelets: 216 10*3/uL (ref 150–400)
WBC: 7.2 10*3/uL (ref 4.0–10.5)

## 2010-11-29 LAB — BASIC METABOLIC PANEL
CO2: 25 mEq/L (ref 19–32)
Calcium: 9.6 mg/dL (ref 8.4–10.5)
Creatinine, Ser: 0.69 mg/dL (ref 0.4–1.2)
GFR calc Af Amer: >60 mL/min (ref >60)
GFR calc non Af Amer: >60 mL/min (ref >60)
Glucose, Bld: 99 mg/dL (ref 70–99)
Sodium: 139 mEq/L (ref 135–145)

## 2010-11-29 LAB — PREGNANCY, URINE: Preg Test, Ur: NEGATIVE

## 2011-01-04 NOTE — Op Note (Signed)
NAMEDIANCA, OWENSBY                 ACCOUNT NO.:  [id number]   MEDICAL RECORD NO.:  [id number]          PATIENT TYPE:  AMB   LOCATION:  SDC                           FACILITY:  [facility]   PHYSICIAN:  M. Leda Quail, MD  DATE OF BIRTH:  04/16/1956   DATE OF PROCEDURE:  02/16/2009  DATE OF DISCHARGE:                               OPERATIVE REPORT   PREOPERATIVE DIAGNOSES:  9. 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  2. Continued estrogen production, post chemotherapy and radiation.  3. Recommendation for bilateral salpingo-oophorectomy from the      patient's oncologist.   POSTOPERATIVE DIAGNOSES:  18. 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  2. Continued estrogen production, post chemotherapy and radiation.  3. Recommendation for bilateral salpingo-oophorectomy from the      patient's oncologist.   PROCEDURE:  Laparoscopic bilateral salpingo-oophorectomy.   SURGEON:  M. Leda Quail, MD   ASSISTANT:  Edwena Felty. Romine, MD   ANESTHESIA:  General endotracheal, Dr. Malen Gauze oversaw the case.   FINDINGS:  Normal pelvis with normal-appearing tubes, uterus, ovaries,  anterior and posterior cul-de-sacs, and normal upper abdomen with a  normal-appearing liver edge and stomach edge.   SPECIMENS:  Tubes and ovaries sent to Pathology.   ESTIMATED BLOOD LOSS:  Minimal.   FLUIDS:  1100 mL of LR.   URINE OUTPUT:  100 mL of clear urine drained in a Foley catheter during  the procedure.   COMPLICATIONS:  None.   INDICATIONS:  Ms. Autry Droege is a very nice 55 year old, G0, married  white female, who has diagnosis of invasive ductal breast cancer in  2008.  She has undergone lumpectomy, sentinel node biopsy, chemotherapy,  and radiation.  With her follow up with Dr. Donnie Coffin this year, she has  continued to have estrogen  production and they discussed benefits of  ovarian removal versus ovarian preservation and possibly changing oral  chemotherapy agents.  The patient is most interested in removing ovaries  at this time.  She and her husband came for consultation.  We discussed  risks and benefits which is all documented in her office chart, and she  has decided to proceed.  The patient presents for procedure today.   OPERATIVE REPORT:  The patient was taken to the operating room.  She had  a running IV in her right hand and she is placed in supine position.  General endotracheal anesthesia was administered by the anesthesia staff  without difficulty.  Abdomen, perineum, inner thighs, and vagina were  prepped after the patient's legs were positioned in [place] stirrups.  SCDs were present on the lower extremities and IV antibiotics was  initiated prior to coming its operating room.  Once the patient was  prepped, a bivalve speculum was placed in the vagina.  The anterior lip  of the cervix was grasped with a single-tooth tenaculum.  [employer] uterine  manipulators was  advanced to the cervical os and attached to the  tenaculum as a means to manipulate the uterus during the procedure.  The  bivalve speculum was removed.  A Foley catheter was inserted in the  bladder and placed on straight drain for the procedure.  Legs were  positioned in the low lithotomy position.  Attention was turned to the  abdomen.  She was draped in normal sterile fashion.  Then, decision was  made to make the incision slightly above the umbilicus due to the  patient's anatomy, 0.25% Marcaine was used to anesthetize skin.  A #11  blade was used to make a 10-mm skin incision.  Subcutaneous fat tissue  was dissected.  The abdomen was elevated.  A Veress needle was inserted  in through the abdominal wall layers aiming toward the pelvis.  Once  peritoneum was popped through the CO2 gas was attached to the Veress  needle and under low pressures  CO2, a pneumoperitoneum was achieved.  Pressures were never above 12 mmHg.  Once 3 L of gas was in the abdomen.  The Veress needle was removed.  An OptiVu trocar which was non bladed.  It was attached to the laparoscope and this was advanced with a twisting  motion into the abdomen.  The abdominal wall layers were visualized as  they were traversed and the peritoneum was visualized as it was passed  through.  The trocar was removed and the laparoscope was used as a  survey the pelvis.  The uterus, tubes, ovaries, anterior and posterior  cul-de-sacs, and upper abdomen was visualized.  The appendix could not  be visualized.  The ovaries were freely mobile.  They were small tubes  were normal.  There were no adhesions.  Bilateral ureteral peristalsis  was noted.  At this point, the patient was placed in some Trendelenburg.  The right and left lower quadrants were transilluminated and placement  sites were low were identified.  The skin was anesthetized with 0.25%  Marcaine.  A 5-mm skin incisions made with #11 blade.  A 5-mm non blunt  trocars and ports were passed into the pelvis under direct visualization  of the laparoscope.  A smooth grasper with an EnSeal device were  obtained.  Attention was turned to the right side.  The uterus was  placed on stretch to the patient's left.  The right IP ligament was  serially clamped, cauterized, and incised using the EnSeal device.  Once  this was performed and the IP ligament was completely divided, the utero-  ovarian pedicle on the patient's right was serially clamped, cauterized,  and incised.  Then the remaining mesosalpinx was clamped, cauterized,  and incised to free the right ovary.  This was placed in the pelvis.  The uterus was then placed on stretch to the right.  In a similar  fashion, the left IP ligament was serially clamped, cauterized, and  incised.  The utero-ovarian ligament was serially clamped, cauterized,  and incised, and then  the remaining mesosalpinx was clamped, cauterized,  and incised.  There is a excellent hemostasis along the pedicles.  The  diagnostic scope at this point was changed to an operative scope and a  toothed grasper was obtained.  This was used to grab each ovary and  bring it up to the midline.  Tubes and ovaries were completely removed  from the pelvis.  At this point, a syringe with a normal saline attached  to an endoscopic probe was used to irrigate  the pelvis.  No bleeding was  noted from IP ligaments or utero-ovarian ligaments.  Ureteral  peristalsis was noted bilaterally.  At this point, procedure was ended.  Instruments were removed from the abdomen.  The patient was placed back  in the supine position.  She was taken out of the Trendelenburg  positioning.  The right and left lower quadrant ports removed under  direct visualization of the laparoscope.  The laparoscope was removed  from the midline.  The pneumoperitoneum was relieved.  Then C.R.N.A.  gave the patient several deep breaths to help to get all of the air out  of the abdomen.  Then the midline port was removed.  The fascial  incision at the midline was so small that I could not get my small  finger into the incision and decision was made not to close the fascial  level of this incision.  The skin was closed with subcuticular stitches  of 3-0 Vicryl, and the incisions were cleansed and dried and Dermabond  was applied.  At this point, the Betadine prep was cleansed off the  skin.  The instruments were removed from the vagina and the Foley  catheter was removed.  There was small amount of bleeding from the  tenaculum sites on the cervix, but this rapidly improved.  The patient  was positioned back in the supine position.  She was awakened from  anesthesia and extubated without difficulty.   Sponge, lap, needle, and instrument counts were correct x2.  The patient  tolerated the procedure well.  She was taken to the recovery  room at  this point in stable condition.       Lum Keas, MD  Electronically Signed     MSM/MEDQ  D:  02/16/2009  T:  02/16/2009  Job:  [id number]   cc:   Pierce Crane, MD  Fax: [phone]

## 2011-01-07 NOTE — Op Note (Signed)
Marilyn Hansen, Marilyn Hansen                 ACCOUNT NO.:  [id number]   MEDICAL RECORD NO.:  [id number]          PATIENT TYPE:  AMB   LOCATION:  DSC                          FACILITY:  [facility]   PHYSICIAN:  Rose Phi. Maple Hudson, M.D.   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   OPERATIONS:  1. Completion left axillary lymph node dissection.  2. Attempted port placement.   SURGEON:  Rose Phi. Maple Hudson, M.D.   ANESTHESIA:  General.   OPERATIVE PROCEDURE:  After suitable general anesthesia was induced, the  patient was placed in the supine position with the arms extended on the  arm board.  The left axilla was prepped and draped in the usual fashion.   The previous sentinel node biopsy was then incised and was extended a  little bit medially and laterally, and then I dissected down and exposed  the pectoralis major muscle.  We then incised along the pectoralis major  muscle, retracting it and exposing the pectoralis minor.  We incised  along it and retracted it, too, as we went in a cephalad direction and  identified the axillary vein.  We opened the clavipectoral fascia along  the lower margin of the axillary vein and then dissected out all the  tissue from inferior to the vein and from behind the pectoralis minor  muscle.  The long thoracic thoracodorsal nerves were identified and  preserved, and cutaneous vessels and nerves were clipped and divided.  Following the completion of the dissection, we had good hemostasis.  We  thoroughly irrigate the field with saline.  A 19-French Blake drain was  inserted and brought out through a separate stab wound.  Incision was  closed with 3-0 Vicryl and skin staples.   We then reprepped and redraped in order to place the port.  We did  several  subclavian punctures and was easily getting into the vein, but I  could not manipulate the wire to make it go down into the superior vena  cava.  Every time, it went across onto the left side.   We then asked Dr. Germaine Pomfret from anesthesia to do an internal  jugular, and that we could not get either because we just simply could  not get the vein.  We abandoned the procedure.   Dressings were applied and the patient transferred to the recovery room  in satisfactory condition, having tolerated procedure well.      Rose Phi. Maple Hudson, M.D.  Electronically Signed     PRY/MEDQ  D:  10/13/2006  T:  10/13/2006  Job:  [id number]

## 2011-01-07 NOTE — Op Note (Signed)
NAMEBRIDIE, COLQUHOUN                 ACCOUNT NO.:  [id number]   MEDICAL RECORD NO.:  [id number]          PATIENT TYPE:  AMB   LOCATION:  DSC                          FACILITY:  [facility]   PHYSICIAN:  Rose Phi. Maple Hudson, M.D.   DATE OF BIRTH:  1956/02/22   DATE OF PROCEDURE:  09/29/2006  DATE OF DISCHARGE:                               OPERATIVE REPORT   PREOPERATIVE DIAGNOSIS:  Stage I carcinoma of the left breast.   POSTOPERATIVE DIAGNOSIS:  Stage I carcinoma of the left breast.   OPERATION:  1. Blue dye injection.  2. Left partial mastectomy with needle localization and specimen      mammogram.  3. Left sentinel lymph node biopsy.   SURGEON:  Rose Phi. Maple Hudson, M.D.   ANESTHESIA:  General.   OPERATIVE PROCEDURE:  Prior to coming to the operating room, a  localizing wire had been placed in the upper outer quadrant of the left  breast.  Also, 1 mCi of technetium sulfur colloid was injected  intradermally.   After suitable general anesthesia was induced, the patient was placed in  the supine position with the arms extended on the arm board.  5 mL of a  mixture of 2 mL of methylene blue and 3 mL of injectable saline was  injected in the subareolar tissue and the breast gently massaged for  three minutes.  We then prepped and draped the breast and axilla.   Using the wire as a guide, a curved incision was then outlined and the  incision made and the wire exposed and then a wide excision of the wire  and surrounding tissue was carried out including going down to the  capsule of the implants.  The specimen was oriented for the pathologist  and then submitted for specimen mammogram.   While that was being done, we carefully scanned the left axilla with the  NeoProbe.  There was clearly one hot spot.  I made a short transverse  axillary incision with dissection through subcutaneous tissue to the  clavipectoral fascia.  Deep to the fascia were two blue nodes, one of  which was quite hot and  the other which had a relatively low  radioactivity.  Both were removed as sentinel nodes and there were no  other blue hot or palpable nodes.   While those were being evaluated by the pathologist, we closed the  incision with 3-0 Vicryl and subcuticular 4-0 Monocryl.   The sentinel nodes were reported as negative for metastatic disease.  The lumpectomy specimen was reported including the clip in the mass on  the specimen mammogram and the pathologist reported that the margins  were probably clear but he was concerned about the inferior and the  inferolateral margins.  As a result of that, I excised  more widely the inferior and inferolateral margins as separate tissue to  be looked that.  Both incisions were injected with 0.25% Marcaine and  then closed with subcuticular 4-0 Monocryl and Dermabond.  Dressings  were then applied.  The patient was transferred to the recovery room in  satisfactory condition  having tolerated procedure well.      Rose Phi. Maple Hudson, M.D.  Electronically Signed     PRY/MEDQ  D:  09/29/2006  T:  09/30/2006  Job:  [id number]

## 2011-01-07 NOTE — Op Note (Signed)
Marilyn Hansen, Marilyn Hansen                 ACCOUNT NO.:  [id number]   MEDICAL RECORD NO.:  [id number]          PATIENT TYPE:  AMB   LOCATION:  DSC                          FACILITY:  [facility]   PHYSICIAN:  Gita Kudo, M.D. DATE OF BIRTH:  April 04, 1956   DATE OF PROCEDURE:  10/11/2005  DATE OF DISCHARGE:                                 OPERATIVE REPORT   OPERATIVE PROCEDURE:  Left breast biopsy with needle localization and  specimen mammogram.   SURGEON:  Gita Kudo, M.D.   ANESTHESIA:  MAC - IV sedation, local 1% Xylocaine.   PREOPERATIVE DIAGNOSIS:  Abnormal mammogram and fine needle aspiration core  biopsy, left breast lesion, total excision recommended.   POSTOPERATIVE DIAGNOSIS:  Abnormal mammogram and fine needle aspiration core  biopsy, left breast lesion, total excision recommended.   CLINICAL SUMMARY:  55 year old woman brought in for elective left breast  biopsy.  She has had augmentation mammoplasty several years ago and recent  mammogram showed an abnormal area in the left breast that had core biopsy  that was inconclusive and excision recommended.  It had wire localization  and specimen mammogram.   OPERATIVE FINDINGS:  The breast tissue looked and felt normal.  We did not  encounter her implant.  I removed the area around the wire and the mammogram  of the specimen showed that the lesion was removed.   OPERATIVE PROCEDURE:  Under satisfactory intravenous sedation, the patient  was prepped, draped and positioned in standard fashion.  A total of 30 mL of  1% Xylocaine was infiltrated during the procedure for both intraoperative  and postop analgesia.  The films were reviewed and a mark made on the skin  where the tip of the wire was, according to the measurements given of 55 mm.  A curved circumareolar incision made at that place - about 9 o'clock in the  left breast.  This was carried down carefully through the periareolar tissue  and breast tissue.  The wire was  identified and pulled from below downward  into the incision.  Then using the wire as a guide I went around the wire  with a conservative but generous margin to insure removal of the lesion.  When I was satisfied that I had removed the area, it was sent for specimen  mammogram.  The wound was made dry by cautery and closed in layers  with interrupted Vicryl suture and skin edges approximated with running  nylon.  A sterile absorbent dressing was applied when we received word from  x-ray that the lesion was included in the mammogram.  The patient will be  followed as an outpatient.           ______________________________  Gita Kudo, M.D.     MRL/MEDQ  D:  10/11/2005  T:  10/11/2005  Job:  [id number]   cc:   Daryl Eastern, M.D.  Fax: [phone]   Edwena Felty. Romine, M.D.  Fax: [phone]   Sigmund Hazel, M.D.  Fax: [phone]

## 2011-04-22 ENCOUNTER — Ambulatory Visit
Admission: RE | Admit: 2011-04-22 | Discharge: 2011-04-22 | Disposition: A | Discharge status: Home / Self Care | Payer: 59 | Source: Ambulatory Visit | Attending: Oncology | Admitting: Oncology

## 2011-04-22 DIAGNOSIS — Z853 Personal history of malignant neoplasm of breast: Secondary | ICD-10-CM

## 2011-05-12 ENCOUNTER — Other Ambulatory Visit: Payer: Self-pay | Admitting: Oncology

## 2011-05-12 ENCOUNTER — Encounter (HOSPITAL_BASED_OUTPATIENT_CLINIC_OR_DEPARTMENT_OTHER): Payer: 59 | Admitting: Oncology

## 2011-05-12 DIAGNOSIS — C50419 Malignant neoplasm of upper-outer quadrant of unspecified female breast: Secondary | ICD-10-CM

## 2011-05-12 LAB — CBC WITH DIFFERENTIAL/PLATELET
BASO%: 0.3 % (ref 0.0–2.0)
Eosinophils Absolute: 0.3 10*3/uL (ref 0.0–0.5)
LYMPH%: 23.8 % (ref 14.0–49.7)
MCHC: 34.8 g/dL (ref 31.5–36.0)
MONO#: 0.6 10*3/uL (ref 0.1–0.9)
NEUT#: 5.8 10*3/uL (ref 1.5–6.5)
Platelets: 276 10*3/uL (ref 145–400)
RBC: 4.34 10*6/uL (ref 3.70–5.45)
WBC: 8.8 10*3/uL (ref 3.9–10.3)
lymph#: 2.1 10*3/uL (ref 0.9–3.3)

## 2011-05-12 LAB — COMPREHENSIVE METABOLIC PANEL
ALT: 20 U/L (ref 0–35)
Albumin: 4 g/dL (ref 3.5–5.2)
CO2: 31 mEq/L (ref 19–32)
Calcium: 10 mg/dL (ref 8.4–10.5)
Chloride: 98 mEq/L (ref 96–112)
Glucose, Bld: 102 mg/dL — ABNORMAL HIGH (ref 70–99)
Potassium: 3 mEq/L — ABNORMAL LOW (ref 3.5–5.3)
Sodium: 138 mEq/L (ref 135–145)
Total Protein: 7.6 g/dL (ref 6.0–8.3)

## 2011-05-13 LAB — CANCER ANTIGEN 27.29: CA 27.29: 18 U/mL (ref 0–39)

## 2011-05-19 ENCOUNTER — Encounter (HOSPITAL_BASED_OUTPATIENT_CLINIC_OR_DEPARTMENT_OTHER): Payer: 59 | Admitting: Oncology

## 2011-08-08 ENCOUNTER — Other Ambulatory Visit: Payer: Self-pay | Admitting: Oncology

## 2011-08-08 DIAGNOSIS — Z853 Personal history of malignant neoplasm of breast: Secondary | ICD-10-CM

## 2011-09-12 ENCOUNTER — Ambulatory Visit
Admission: RE | Admit: 2011-09-12 | Discharge: 2011-09-12 | Disposition: A | Discharge status: Home / Self Care | Payer: 59 | Source: Ambulatory Visit | Attending: Oncology | Admitting: Oncology

## 2011-09-12 DIAGNOSIS — Z853 Personal history of malignant neoplasm of breast: Secondary | ICD-10-CM

## 2011-09-15 NOTE — Progress Notes (Signed)
[facility] pt.  Signed by PA and faxed back to [phone].

## 2011-09-23 ENCOUNTER — Telehealth: Payer: Self-pay | Admitting: Oncology

## 2011-09-23 NOTE — Telephone Encounter (Signed)
called pt and scheduled march2013 appts

## 2011-11-10 ENCOUNTER — Other Ambulatory Visit: Payer: 59 | Admitting: Lab

## 2011-11-17 ENCOUNTER — Telehealth: Payer: Self-pay | Admitting: *Deleted

## 2011-11-17 ENCOUNTER — Ambulatory Visit (HOSPITAL_BASED_OUTPATIENT_CLINIC_OR_DEPARTMENT_OTHER): Payer: 59 | Admitting: Oncology

## 2011-11-17 VITALS — BP 135/76 | HR 108 | Temp 98.6°F | Ht 65.5 in | Wt 175.3 lb

## 2011-11-17 DIAGNOSIS — E559 Vitamin D deficiency, unspecified: Secondary | ICD-10-CM

## 2011-11-17 DIAGNOSIS — C50919 Malignant neoplasm of unspecified site of unspecified female breast: Secondary | ICD-10-CM

## 2011-11-17 NOTE — Progress Notes (Signed)
Hematology and Oncology Follow Up Visit  Marilyn Hansen [id number] Mar 08, 1956 56 y.o. 11/17/2011 10:37 PM PCP Dr Rondel Baton  Principle Diagnosis: 56 year old woman with history of T2 N1 ER/PR positive breast cancer, status post 4 cycles of TC chemotherapy followed by radiation completed 04/18/2010 on Femara  Interim History:  There have been no intercurrent illness, hospitalizations or medication changes.  Medications: I have reviewed the patient's current medications.  Allergies: No Known Allergies  Past Medical History, Surgical history, Social history, and Family History were reviewed and updated.  Review of Systems: Constitutional:  Negative for fever, chills, night sweats, anorexia, weight loss, pain. Cardiovascular: no chest pain or dyspnea on exertion Respiratory: no cough, shortness of breath, or wheezing Neurological: negative Dermatological: negative ENT: negative Skin Gastrointestinal: negative Genito-Urinary: negative Hematological and Lymphatic: negative Breast: negative Musculoskeletal: negative Remaining ROS negative.  Physical Exam: Blood pressure 135/76, pulse 108, temperature 98.6 F (37 C), temperature source Oral, height 5' 5.5" (1.664 m), weight 175 lb 4.8 oz (79.516 kg). ECOG: 0 General appearance: alert, cooperative and appears stated age Head: Normocephalic, without obvious abnormality, atraumatic Neck: no adenopathy, no carotid bruit, no JVD, supple, symmetrical, trachea midline and thyroid not enlarged, symmetric, no tenderness/mass/nodules Lymph nodes: Cervical, supraclavicular, and axillary nodes normal. Cardiac : regular rate and rhythm, no murmurs or gallops Pulmonary:clear to auscultation bilaterally and normal percussion bilaterally Breasts: inspection negative, no nipple discharge or bleeding, no masses or nodularity palpable Abdomen:soft, non-tender; bowel sounds normal; no masses,  no organomegaly Extremities negative Neuro: alert, oriented,  normal speech, no focal findings or movement disorder noted  Lab Results: Lab Results  Component Value Date   WBC 8.8 05/12/2011   HGB 13.9 05/12/2011   HCT 39.8 05/12/2011   MCV 91.8 05/12/2011   PLT 276 05/12/2011     Chemistry      Component Value Date/Time   NA 138 05/12/2011 1557   K 3.0* 05/12/2011 1557   CL 98 05/12/2011 1557   CO2 31 05/12/2011 1557   BUN 19 05/12/2011 1557   CREATININE 0.77 05/12/2011 1557      Component Value Date/Time   CALCIUM 10.0 05/12/2011 1557   ALKPHOS 105 05/12/2011 1557   AST 20 05/12/2011 1557   ALT 20 05/12/2011 1557   BILITOT 0.2* 05/12/2011 1557      .pathology. Radiological Studies: chest X-ray n/a Mammogram 1/13 Bone density Due 2014  Impression and Plan: The patient is doing well. We unfortunately do not have blood work today. I will let her know that period we will get repeat a sample drawn and then see her again in followup in 6 months time. Likely she appears to be doing well without evidence of recurrence. She continues to remain anxious. She is tolerating the Femara well.  More than 50% of the visit was spent in patient-related counselling   Pierce Crane, MD 3/28/201310:37 PM

## 2011-11-17 NOTE — Telephone Encounter (Signed)
gave patient appointment for 05-2012 printed out calendar and gave to the patient 

## 2011-12-01 ENCOUNTER — Other Ambulatory Visit: Payer: Self-pay | Admitting: *Deleted

## 2011-12-01 DIAGNOSIS — M949 Disorder of cartilage, unspecified: Secondary | ICD-10-CM

## 2011-12-01 DIAGNOSIS — C50919 Malignant neoplasm of unspecified site of unspecified female breast: Secondary | ICD-10-CM

## 2011-12-01 DIAGNOSIS — M899 Disorder of bone, unspecified: Secondary | ICD-10-CM

## 2011-12-08 ENCOUNTER — Other Ambulatory Visit (HOSPITAL_BASED_OUTPATIENT_CLINIC_OR_DEPARTMENT_OTHER): Payer: 59 | Admitting: Lab

## 2011-12-08 DIAGNOSIS — C50919 Malignant neoplasm of unspecified site of unspecified female breast: Secondary | ICD-10-CM

## 2011-12-08 DIAGNOSIS — M899 Disorder of bone, unspecified: Secondary | ICD-10-CM

## 2011-12-08 LAB — CBC WITH DIFFERENTIAL/PLATELET
BASO%: 0.4 % (ref 0.0–2.0)
Eosinophils Absolute: 0.1 10*3/uL (ref 0.0–0.5)
LYMPH%: 25.3 % (ref 14.0–49.7)
MCHC: 32.9 g/dL (ref 31.5–36.0)
MONO#: 0.6 10*3/uL (ref 0.1–0.9)
NEUT#: 4.9 10*3/uL (ref 1.5–6.5)
Platelets: 273 10*3/uL (ref 145–400)
RBC: 4.34 10*6/uL (ref 3.70–5.45)
RDW: 13.4 % (ref 11.2–14.5)
WBC: 7.6 10*3/uL (ref 3.9–10.3)
lymph#: 1.9 10*3/uL (ref 0.9–3.3)
nRBC: 0 % (ref 0–0)

## 2011-12-08 LAB — COMPREHENSIVE METABOLIC PANEL
ALT: 26 U/L (ref 0–35)
AST: 23 U/L (ref 0–37)
Calcium: 10.4 mg/dL (ref 8.4–10.5)
Chloride: 101 mEq/L (ref 96–112)
Creatinine, Ser: 0.7 mg/dL (ref 0.50–1.10)
Sodium: 141 mEq/L (ref 135–145)
Total Protein: 8.1 g/dL (ref 6.0–8.3)

## 2012-05-22 ENCOUNTER — Other Ambulatory Visit: Payer: Self-pay | Admitting: *Deleted

## 2012-05-22 DIAGNOSIS — C50919 Malignant neoplasm of unspecified site of unspecified female breast: Secondary | ICD-10-CM

## 2012-05-22 DIAGNOSIS — E559 Vitamin D deficiency, unspecified: Secondary | ICD-10-CM

## 2012-05-22 MED ORDER — LETROZOLE 2.5 MG PO TABS: 2.5000 mg | ORAL_TABLET | Freq: Every day | ORAL | Status: DC

## 2012-06-18 ENCOUNTER — Other Ambulatory Visit (HOSPITAL_BASED_OUTPATIENT_CLINIC_OR_DEPARTMENT_OTHER): Payer: 59 | Admitting: Lab

## 2012-06-18 DIAGNOSIS — C50919 Malignant neoplasm of unspecified site of unspecified female breast: Secondary | ICD-10-CM

## 2012-06-18 DIAGNOSIS — E559 Vitamin D deficiency, unspecified: Secondary | ICD-10-CM

## 2012-06-18 LAB — COMPREHENSIVE METABOLIC PANEL (CC13)
BUN: 20 mg/dL (ref 7.0–26.0)
CO2: 26 mEq/L (ref 22–29)
Creatinine: 0.9 mg/dL (ref 0.6–1.1)
Glucose: 97 mg/dl (ref 70–99)
Total Bilirubin: 0.28 mg/dL (ref 0.20–1.20)

## 2012-06-18 LAB — CBC WITH DIFFERENTIAL/PLATELET
Basophils Absolute: 0 10*3/uL (ref 0.0–0.1)
Eosinophils Absolute: 0.2 10*3/uL (ref 0.0–0.5)
HGB: 13.7 g/dL (ref 11.6–15.9)
LYMPH%: 27.5 % (ref 14.0–49.7)
MCH: 32.5 pg (ref 25.1–34.0)
MCV: 95.2 fL (ref 79.5–101.0)
MONO%: 9.8 % (ref 0.0–14.0)
NEUT#: 4.3 10*3/uL (ref 1.5–6.5)
Platelets: 255 10*3/uL (ref 145–400)

## 2012-06-26 ENCOUNTER — Ambulatory Visit (HOSPITAL_BASED_OUTPATIENT_CLINIC_OR_DEPARTMENT_OTHER): Payer: 59 | Admitting: Oncology

## 2012-06-26 VITALS — BP 128/85 | HR 94 | Temp 98.4°F | Resp 20 | Ht 65.5 in | Wt 172.7 lb

## 2012-06-26 DIAGNOSIS — Z170 Estrogen receptor positive status [ER+]: Secondary | ICD-10-CM

## 2012-06-26 DIAGNOSIS — C50419 Malignant neoplasm of upper-outer quadrant of unspecified female breast: Secondary | ICD-10-CM

## 2012-06-26 DIAGNOSIS — C50919 Malignant neoplasm of unspecified site of unspecified female breast: Secondary | ICD-10-CM

## 2012-06-26 NOTE — Progress Notes (Signed)
Hematology and Oncology Follow Up Visit  Marilyn Hansen [id number] 15-Jan-1956 56 y.o. 06/26/2012 5:06 PM PCP Dr Rondel Baton  Principle Diagnosis: 56 year old woman with history of T2 N1 ER/PR positive breast cancer, status post 4 cycles of TC chemotherapy followed by radiation completed 04/18/2010 on Femara  Interim History:  There have been no intercurrent illness, hospitalizations or medication changes.  Medications: I have reviewed the patient's current medications.  Allergies: No Known Allergies  Past Medical History, Surgical history, Social history, and Family History were reviewed and updated.  Review of Systems: Constitutional:  Negative for fever, chills, night sweats, anorexia, weight loss, pain. Cardiovascular: no chest pain or dyspnea on exertion Respiratory: no cough, shortness of breath, or wheezing Neurological: negative Dermatological: negative ENT: negative Skin Gastrointestinal: negative Genito-Urinary: negative Hematological and Lymphatic: negative Breast: negative Musculoskeletal: negative Remaining ROS negative.  Physical Exam: Blood pressure 128/85, pulse 94, temperature 98.4 F (36.9 C), temperature source Oral, resp. rate 20, height 5' 5.5" (1.664 m), weight 172 lb 11.2 oz (78.336 kg). ECOG: 0 General appearance: alert, cooperative and appears stated age Head: Normocephalic, without obvious abnormality, atraumatic Neck: no adenopathy, no carotid bruit, no JVD, supple, symmetrical, trachea midline and thyroid not enlarged, symmetric, no tenderness/mass/nodules Lymph nodes: Cervical, supraclavicular, and axillary nodes normal. Cardiac : regular rate and rhythm, no murmurs or gallops Pulmonary:clear to auscultation bilaterally and normal percussion bilaterally Breasts: inspection negative, no nipple discharge or bleeding, no masses or nodularity palpable Abdomen:soft, non-tender; bowel sounds normal; no masses,  no organomegaly Extremities negative Neuro:  alert, oriented, normal speech, no focal findings or movement disorder noted  Lab Results: Lab Results  Component Value Date   WBC 7.2 06/18/2012   HGB 13.7 06/18/2012   HCT 40.0 06/18/2012   MCV 95.2 06/18/2012   PLT 255 06/18/2012     Chemistry      Component Value Date/Time   NA 139 06/18/2012 1552   NA 141 12/08/2011 1608   K 3.3* 06/18/2012 1552   K 3.7 12/08/2011 1608   CL 102 06/18/2012 1552   CL 101 12/08/2011 1608   CO2 26 06/18/2012 1552   CO2 29 12/08/2011 1608   BUN 20.0 06/18/2012 1552   BUN 17 12/08/2011 1608   CREATININE 0.9 06/18/2012 1552   CREATININE 0.70 12/08/2011 1608      Component Value Date/Time   CALCIUM 10.5* 06/18/2012 1552   CALCIUM 10.4 12/08/2011 1608   ALKPHOS 98 06/18/2012 1552   ALKPHOS 101 12/08/2011 1608   AST 26 06/18/2012 1552   AST 23 12/08/2011 1608   ALT 28 06/18/2012 1552   ALT 26 12/08/2011 1608   BILITOT 0.28 06/18/2012 1552   BILITOT 0.3 12/08/2011 1608      .pathology. Radiological Studies: chest X-ray n/a Mammogram 1/13 Bone density Due 2014  Impression and Plan: The patient is doing well. We unfortunately do not have blood work today. I will let her know that period we will get repeat a sample drawn and then see her again in followup in 6 months time. Likely she appears to be doing well without evidence of recurrence. She continues to remain anxious. She is tolerating the Femara well.  More than 50% of the visit was spent in patient-related counselling   Pierce Crane, MD 11/5/20135:06 PM

## 2012-06-27 ENCOUNTER — Telehealth: Payer: Self-pay | Admitting: *Deleted

## 2012-06-27 NOTE — Telephone Encounter (Signed)
Mailed out calendar to inform the  Patient of the new date and time

## 2012-08-06 ENCOUNTER — Other Ambulatory Visit: Payer: Self-pay | Admitting: Oncology

## 2012-08-06 DIAGNOSIS — Z853 Personal history of malignant neoplasm of breast: Secondary | ICD-10-CM

## 2012-08-06 DIAGNOSIS — Z9889 Other specified postprocedural states: Secondary | ICD-10-CM

## 2012-08-28 ENCOUNTER — Other Ambulatory Visit: Payer: Self-pay | Admitting: *Deleted

## 2012-08-28 DIAGNOSIS — C50919 Malignant neoplasm of unspecified site of unspecified female breast: Secondary | ICD-10-CM

## 2012-08-28 DIAGNOSIS — E559 Vitamin D deficiency, unspecified: Secondary | ICD-10-CM

## 2012-08-28 MED ORDER — LETROZOLE 2.5 MG PO TABS: 2.5000 mg | ORAL_TABLET | Freq: Every day | ORAL | Status: DC

## 2012-09-12 ENCOUNTER — Other Ambulatory Visit: Payer: Self-pay | Admitting: Oncology

## 2012-09-12 ENCOUNTER — Ambulatory Visit
Admission: RE | Admit: 2012-09-12 | Discharge: 2012-09-12 | Disposition: A | Discharge status: Home / Self Care | Payer: 59 | Source: Ambulatory Visit | Attending: Oncology | Admitting: Oncology

## 2012-09-12 DIAGNOSIS — Z853 Personal history of malignant neoplasm of breast: Secondary | ICD-10-CM

## 2012-09-12 DIAGNOSIS — Z9889 Other specified postprocedural states: Secondary | ICD-10-CM

## 2012-09-24 ENCOUNTER — Other Ambulatory Visit: Payer: Self-pay | Admitting: Oncology

## 2012-09-24 ENCOUNTER — Ambulatory Visit
Admission: RE | Admit: 2012-09-24 | Discharge: 2012-09-24 | Disposition: A | Discharge status: Home / Self Care | Payer: 59 | Source: Ambulatory Visit | Attending: Oncology | Admitting: Oncology

## 2012-09-24 DIAGNOSIS — Z9889 Other specified postprocedural states: Secondary | ICD-10-CM

## 2012-09-24 DIAGNOSIS — Z853 Personal history of malignant neoplasm of breast: Secondary | ICD-10-CM

## 2012-10-30 ENCOUNTER — Telehealth: Payer: Self-pay | Admitting: *Deleted

## 2012-10-30 NOTE — Telephone Encounter (Signed)
Called and spoke with patient to reschedule her appt. Confirmed appt. For 12/26/12 at 3pm with Bernell List.  Then will become Dr. Welton Flakes.

## 2012-10-31 ENCOUNTER — Other Ambulatory Visit: Payer: Self-pay | Admitting: Oncology

## 2012-10-31 DIAGNOSIS — N63 Unspecified lump in unspecified breast: Secondary | ICD-10-CM

## 2012-12-19 ENCOUNTER — Other Ambulatory Visit: Payer: 59 | Admitting: Lab

## 2012-12-19 ENCOUNTER — Ambulatory Visit (INDEPENDENT_AMBULATORY_CARE_PROVIDER_SITE_OTHER): Payer: 59 | Admitting: Obstetrics & Gynecology

## 2012-12-19 ENCOUNTER — Encounter: Payer: Self-pay | Admitting: Obstetrics & Gynecology

## 2012-12-19 ENCOUNTER — Ambulatory Visit: Payer: Self-pay | Admitting: Obstetrics & Gynecology

## 2012-12-19 VITALS — BP 118/64 | HR 68 | Ht 65.5 in | Wt 176.6 lb

## 2012-12-19 DIAGNOSIS — C50919 Malignant neoplasm of unspecified site of unspecified female breast: Secondary | ICD-10-CM

## 2012-12-19 DIAGNOSIS — Z01419 Encounter for gynecological examination (general) (routine) without abnormal findings: Secondary | ICD-10-CM

## 2012-12-19 DIAGNOSIS — Z23 Encounter for immunization: Secondary | ICD-10-CM

## 2012-12-19 DIAGNOSIS — E559 Vitamin D deficiency, unspecified: Secondary | ICD-10-CM

## 2012-12-19 DIAGNOSIS — Z0000 Encounter for general adult medical examination without abnormal findings: Secondary | ICD-10-CM

## 2012-12-19 LAB — POCT URINALYSIS DIPSTICK
Leukocytes, UA: NEGATIVE
pH, UA: 6.5

## 2012-12-19 MED ORDER — CLONAZEPAM 0.5 MG PO TABS: 0.5000 mg | ORAL_TABLET | Freq: Two times a day (BID) | ORAL | Status: DC | PRN

## 2012-12-19 NOTE — Progress Notes (Addendum)
57 y.o. G0P0000 MarriedCaucasianF here for annual exam.  No vaginal bleeding.  Doing really well.  Had abnormal MMG in January.  Had a biopsy showed fibroadenoma/usual hyperplasia.  Has follow-up in six months.  Dr. Donnie Coffin is gone.  Seeing Dr. Welton Flakes next week.  Was put on Lexapro after seeing PCP.  Feels it isn't helping but her husband says she is much more easy-going now.   Patient's last menstrual period was 11/21/2006.          Sexually active: yes  The current method of family planning is vasectomy.    Exercising: yes  walking, trainer-a few times a month, and weights-some Smoker:  no  Health Maintenance: Pap:  11/16/11 WNL/negative HR HPV MMG:  1/14 mmg and right breast biopsy-6 month mmg Colonoscopy:  5/09 repeat 5-10 years BMD:   8/12 TDaP:  2004 Labs: Dr. Larwance Sachs at [facility].  Last year.     reports that she has never smoked. She has never used smokeless tobacco. She reports that  drinks alcohol.  Past Medical History  Diagnosis Date  . H/O left breast biopsy 2/07    intraductal papilloma with atypical ductal hyperplasia  . Breast cancer 1/08    invasive breast cancer (T2, N1 ER+/PR+) BRCA 1/11 negative  . Hypertension     Past Surgical History  Procedure Laterality Date  . Breast enhancement surgery  2003    saline  . Breast biopsy  2/07    breast excisional biopsy/chemo  . Bilateral salpingoophorectomy  6/10    l  . Wisdom tooth extraction    . Breast biopsy  1/14 or 2/14    right-benign    Current Outpatient Prescriptions  Medication Sig Dispense Refill  . Ascorbic Acid (VITAMIN C PO) Take by mouth daily.      Marland Kitchen aspirin 81 MG tablet Take by mouth daily.       . Cholecalciferol (VITAMIN D PO) Take by mouth daily.      . clonazePAM (KLONOPIN) 0.5 MG tablet Take 0.5 mg by mouth at bedtime as needed.      Marland Kitchen escitalopram (LEXAPRO) 10 MG tablet 10 mg daily.      . Flaxseed, Linseed, (FLAX SEED OIL PO) Take by mouth daily.      . hydrochlorothiazide  (HYDRODIURIL) 25 MG tablet Take 25 mg by mouth daily.      Marland Kitchen letrozole (FEMARA) 2.5 MG tablet Take 1 tablet (2.5 mg total) by mouth daily.  90 tablet  3  . lovastatin (MEVACOR) 20 MG tablet Take 20 mg by mouth at bedtime.      . Multiple Vitamins-Minerals (MULTIVITAMIN PO) Take by mouth daily.      . Omega-3 Fatty Acids (FISH OIL PO) Take by mouth daily.       No current facility-administered medications for this visit.    Family History  Problem Relation Age of Onset  . Hypertension Maternal Grandmother   . Hypertension Paternal Grandmother   . Heart disease Paternal Grandmother   . Heart disease Maternal Grandmother   . Alzheimer's disease Father   . Anemia Mother   . Osteoporosis Mother   . Alcohol abuse Mother     ROS:  Pertinent items are noted in HPI.  Otherwise, a comprehensive ROS was negative.  Exam:   BP 118/64  Pulse 68  Ht 5' 5.5" (1.664 m)  Wt 176 lb 9.6 oz (80.105 kg)  BMI 28.93 kg/m2  LMP 11/21/2006   Height: 5' 5.5" (166.4 cm)  Ht Readings from Last 3 Encounters:  12/19/12 5' 5.5" (1.664 m)  06/26/12 5' 5.5" (1.664 m)  11/17/11 5' 5.5" (1.664 m)    General appearance: alert, cooperative and appears stated age Head: Normocephalic, without obvious abnormality, atraumatic Neck: no adenopathy, supple, symmetrical, trachea midline and thyroid normal to inspection and palpation Lungs: clear to auscultation bilaterally Breasts: normal appearance, no masses or tenderness, on right, scarring on left with radiation changes--stable Heart: regular rate and rhythm Abdomen: soft, non-tender; bowel sounds normal; no masses,  no organomegaly Extremities: extremities normal, atraumatic, no cyanosis or edema Skin: Skin color, texture, turgor normal. No rashes or lesions Lymph nodes: Cervical, supraclavicular, and axillary nodes normal. No abnormal inguinal nodes palpated Neurologic: Grossly normal   Pelvic: External genitalia:  no lesions              Urethra:  normal  appearing urethra with no masses, tenderness or lesions              Bartholins and Skenes: normal                 Vagina: normal appearing vagina with normal color and discharge, no lesions, atrophy              Cervix: no lesions              Pap taken: no Bimanual Exam:  Uterus:  normal size, contour, position, consistency, mobility, non-tender              Adnexa: normal adnexa and no mass, fullness, tenderness               Rectovaginal: Confirms               Anus:  normal sphincter tone, no lesions  A:  Well Woman with normal exam H/O breast cancer 1/08, still on Femara Anxiety Hypertension Decreased libido--long term issues Elevated lipids  P:   Mammogram yearly.  D/W pt 3D imaging.  Currently on follow-up 6 months mmg. pap smear neg with neg HR HPV 3/13.  No Pap today. Labs with PCP.  He writes rx for Lexapro, Mevacor, HCTZ. Clonazepam 0.5mg  qd prn.  #30/5RF.  Pt has been on this long term. Tdap today. return annually or prn  An After Visit Summary was printed and given to the patient.

## 2012-12-20 ENCOUNTER — Encounter: Payer: Self-pay | Admitting: Obstetrics & Gynecology

## 2012-12-20 LAB — HEMOGLOBIN, FINGERSTICK: Hemoglobin, fingerstick: 14.1 g/dL (ref 12.0–16.0)

## 2012-12-20 NOTE — Patient Instructions (Signed)

## 2012-12-21 ENCOUNTER — Ambulatory Visit: Payer: Self-pay | Admitting: Obstetrics & Gynecology

## 2012-12-26 ENCOUNTER — Ambulatory Visit (HOSPITAL_BASED_OUTPATIENT_CLINIC_OR_DEPARTMENT_OTHER): Payer: 59 | Admitting: Oncology

## 2012-12-26 ENCOUNTER — Ambulatory Visit (HOSPITAL_BASED_OUTPATIENT_CLINIC_OR_DEPARTMENT_OTHER): Payer: 59 | Admitting: Lab

## 2012-12-26 ENCOUNTER — Ambulatory Visit: Payer: 59 | Admitting: Oncology

## 2012-12-26 ENCOUNTER — Encounter: Payer: Self-pay | Admitting: Oncology

## 2012-12-26 ENCOUNTER — Telehealth: Payer: Self-pay | Admitting: Oncology

## 2012-12-26 VITALS — BP 127/80 | HR 89 | Temp 98.3°F | Resp 20 | Ht 65.5 in | Wt 176.8 lb

## 2012-12-26 DIAGNOSIS — C50912 Malignant neoplasm of unspecified site of left female breast: Secondary | ICD-10-CM

## 2012-12-26 DIAGNOSIS — C50419 Malignant neoplasm of upper-outer quadrant of unspecified female breast: Secondary | ICD-10-CM

## 2012-12-26 DIAGNOSIS — Z170 Estrogen receptor positive status [ER+]: Secondary | ICD-10-CM

## 2012-12-26 DIAGNOSIS — M810 Age-related osteoporosis without current pathological fracture: Secondary | ICD-10-CM

## 2012-12-26 LAB — CBC WITH DIFFERENTIAL/PLATELET
Eosinophils Absolute: 0.2 10*3/uL (ref 0.0–0.5)
MCV: 93.6 fL (ref 79.5–101.0)
MONO%: 8.5 % (ref 0.0–14.0)
NEUT#: 4.8 10*3/uL (ref 1.5–6.5)
RBC: 4.22 10*6/uL (ref 3.70–5.45)
RDW: 13 % (ref 11.2–14.5)
WBC: 7.9 10*3/uL (ref 3.9–10.3)
lymph#: 2.2 10*3/uL (ref 0.9–3.3)

## 2012-12-26 LAB — COMPREHENSIVE METABOLIC PANEL (CC13)
ALT: 34 U/L (ref 0–55)
Alkaline Phosphatase: 99 U/L (ref 40–150)
CO2: 26 mEq/L (ref 22–29)
Potassium: 3.3 mEq/L — ABNORMAL LOW (ref 3.5–5.1)
Sodium: 138 mEq/L (ref 136–145)
Total Bilirubin: 0.29 mg/dL (ref 0.20–1.20)
Total Protein: 7.5 g/dL (ref 6.4–8.3)

## 2012-12-27 ENCOUNTER — Telehealth: Payer: Self-pay | Admitting: Medical Oncology

## 2012-12-27 LAB — VITAMIN D 25 HYDROXY (VIT D DEFICIENCY, FRACTURES): Vit D, 25-Hydroxy: 58 ng/mL (ref 30–89)

## 2012-12-27 NOTE — Telephone Encounter (Signed)
LVMOM, per NP informed patient potassium @ 3.3 and NP would like for pt to increase potassium rich foods in her diet. I listed several foods for pt that are high in potassium. Encouraged pt to call back with any questions or concerns.

## 2012-12-27 NOTE — Telephone Encounter (Signed)
Message copied by Rexene Edison on Thu Dec 27, 2012  3:52 PM ------      Message from: Laural Golden      Created: Thu Dec 27, 2012  1:32 PM       Patient has low potassium.  Please call and instruct to increase potassium rich foods in diet.       L      ----- Message -----         From: [facility] Interface         Sent: 12/26/2012   4:20 PM           To: Victorino December, MD                   ------

## 2012-12-30 NOTE — Progress Notes (Signed)
OFFICE PROGRESS NOTE  CC  BABAOFF,MARC, MD [address] [place] [address] Dr. Leda Quail Dr. 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 The tumor was ER positive PR positive HER-2/neu negative.  #2 patient had BRCA1/2 testing and was negative.   #3 patient had Oncotype DX testing performed she was in the intermediate risk category therefore she was begun on adjuvant chemotherapy consisting of 4 cycles of Taxotere and Cytoxan.  #4 she then went on to have radiation therapy which was completed a 28 2011. Subsequently she was started on letrozole 2.5 mg daily.  CURRENT THERAPY:letrozole 2.5 mg daily  INTERVAL HISTORY: Marilyn Hansen 57 y.o. female returns for followup visit. Clinically she's doing well without any problems she has no evidence of recurrent disease. Recently patient had a mammogram that showed an abnormality in the right breast she underwent biopsy that was benign. Today patient feels well she has no nausea no vomiting no fevers chills night sweats she's tolerating letrozole without any problems. Remainder of the 10 point review of systems is negative.  MEDICAL HISTORY: Past Medical History  Diagnosis Date  . H/O left breast biopsy 2/07    intraductal papilloma with atypical ductal hyperplasia  . Breast cancer 1/08    invasive breast cancer (T2, N1 ER+/PR+) BRCA 1/11 negative  . Hypertension     ALLERGIES:  is allergic to dayquil.  MEDICATIONS:  Current Outpatient Prescriptions  Medication Sig Dispense Refill  . Ascorbic Acid (VITAMIN C PO) Take by mouth daily.      Marland Kitchen aspirin 81 MG tablet Take by mouth daily.       . Cholecalciferol (VITAMIN D PO) Take by mouth daily.      Marland Kitchen escitalopram (LEXAPRO) 10 MG tablet 10 mg daily.      . Flaxseed, Linseed, (FLAX SEED OIL PO)  Take by mouth daily.      . hydrochlorothiazide (HYDRODIURIL) 25 MG tablet Take 25 mg by mouth daily.      Marland Kitchen letrozole (FEMARA) 2.5 MG tablet Take 1 tablet (2.5 mg total) by mouth daily.  90 tablet  3  . lovastatin (MEVACOR) 20 MG tablet Take 20 mg by mouth at bedtime.      . Multiple Vitamins-Minerals (MULTIVITAMIN PO) Take by mouth daily.      . Omega-3 Fatty Acids (FISH OIL PO) Take by mouth daily.      . clonazePAM (KLONOPIN) 0.5 MG tablet Take 1 tablet (0.5 mg total) by mouth 2 (two) times daily as needed.  30 tablet  5   No current facility-administered medications for this visit.    SURGICAL HISTORY:  Past Surgical History  Procedure Laterality Date  . Breast enhancement surgery  2003    saline  . Breast biopsy  2/07    breast excisional biopsy/chemo  . Bilateral salpingoophorectomy  6/10    l  . Wisdom tooth extraction    . Breast biopsy  1/14 or 2/14    right-benign    REVIEW OF SYSTEMS:  Pertinent items are noted in HPI.   HEALTH MAINTENANCE:   PHYSICAL EXAMINATION: Blood pressure 127/80, pulse 89, temperature 98.3 F (36.8 C), temperature source Oral, resp. rate 20, height 5' 5.5" (1.664 m), weight 176 lb 12.8 oz (80.196 kg), last menstrual period 11/21/2006. Body mass index is 28.96 kg/(m^2). ECOG  PERFORMANCE STATUS: 0 - Asymptomatic Well-developed nourished female in no acute distress HEENT exam: EOMI PERRLA sclerae anicteric no conjunctival pallor oral mucosa is moist neck is supple lungs clear Cardiovascular regular rate rhythm Abdomen soft nontender nondistended bowel sounds present no HSM Extremities no edema Neuro nonfocal Breast exam left breast well-healed surgical scar no masses nipple discharge   LABORATORY DATA: Lab Results  Component Value Date   WBC 7.9 12/26/2012   HGB 13.3 12/26/2012   HCT 39.5 12/26/2012   MCV 93.6 12/26/2012   PLT 271 12/26/2012      Chemistry      Component Value Date/Time   NA 138 12/26/2012 1613   NA 141 12/08/2011 1608   K  3.3* 12/26/2012 1613   K 3.7 12/08/2011 1608   CL 102 12/26/2012 1613   CL 101 12/08/2011 1608   CO2 26 12/26/2012 1613   CO2 29 12/08/2011 1608   BUN 20.5 12/26/2012 1613   BUN 17 12/08/2011 1608   CREATININE 0.9 12/26/2012 1613   CREATININE 0.70 12/08/2011 1608      Component Value Date/Time   CALCIUM 9.9 12/26/2012 1613   CALCIUM 10.4 12/08/2011 1608   ALKPHOS 99 12/26/2012 1613   ALKPHOS 101 12/08/2011 1608   AST 33 12/26/2012 1613   AST 23 12/08/2011 1608   ALT 34 12/26/2012 1613   ALT 26 12/08/2011 1608   BILITOT 0.29 12/26/2012 1613   BILITOT 0.3 12/08/2011 1608       RADIOGRAPHIC STUDIES:  No results found.  ASSESSMENT: 57 year old female with  #1 history of stage II (T2 N1) invasive ductal carcinoma of the left breast status post lumpectomy with sentinel lymph node biopsy. Patient is status post adjuvant chemotherapy consisting of Taxotere Cytoxan x4 cycles. Followed by radiation which she completed a 28 2011. She subsequently was started on letrozole 2.5 mg daily. She remains on it. She is tolerating it well. She has no evidence of recurrent disease.   PLAN:   #1 patient will continue letrozole 2.5 mg daily.  #2 she will be seen back in 6 months time for followup.   All questions were answered. The patient knows to call the clinic with any problems, questions or concerns. We can certainly see the patient much sooner if necessary.  I spent 25 minutes counseling the patient face to face. The total time spent in the appointment was 30 minutes.    Drue Second, MD Medical/Oncology [facility] [phone] (beeper) [phone] (Office)

## 2013-03-27 ENCOUNTER — Ambulatory Visit
Admission: RE | Admit: 2013-03-27 | Discharge: 2013-03-27 | Disposition: A | Discharge status: Home / Self Care | Payer: 59 | Source: Ambulatory Visit | Attending: Oncology | Admitting: Oncology

## 2013-03-27 DIAGNOSIS — N63 Unspecified lump in unspecified breast: Secondary | ICD-10-CM

## 2013-04-24 ENCOUNTER — Other Ambulatory Visit: Payer: 59

## 2013-05-08 ENCOUNTER — Ambulatory Visit
Admission: RE | Admit: 2013-05-08 | Discharge: 2013-05-08 | Disposition: A | Discharge status: Home / Self Care | Payer: 59 | Source: Ambulatory Visit | Attending: Oncology | Admitting: Oncology

## 2013-05-08 DIAGNOSIS — M810 Age-related osteoporosis without current pathological fracture: Secondary | ICD-10-CM

## 2013-06-06 ENCOUNTER — Ambulatory Visit: Admit: 2013-06-06 | Payer: Self-pay | Admitting: Orthopedic Surgery

## 2013-06-06 SURGERY — ANTERIOR CERVICAL DECOMPRESSION/DISCECTOMY FUSION 2 LEVELS
Anesthesia: General

## 2013-07-03 ENCOUNTER — Other Ambulatory Visit (HOSPITAL_BASED_OUTPATIENT_CLINIC_OR_DEPARTMENT_OTHER): Payer: 59 | Admitting: Lab

## 2013-07-03 ENCOUNTER — Encounter (INDEPENDENT_AMBULATORY_CARE_PROVIDER_SITE_OTHER): Payer: Self-pay

## 2013-07-03 ENCOUNTER — Encounter: Payer: Self-pay | Admitting: Oncology

## 2013-07-03 ENCOUNTER — Ambulatory Visit (HOSPITAL_BASED_OUTPATIENT_CLINIC_OR_DEPARTMENT_OTHER): Payer: 59 | Admitting: Oncology

## 2013-07-03 ENCOUNTER — Telehealth: Payer: Self-pay | Admitting: Oncology

## 2013-07-03 VITALS — BP 123/79 | HR 86 | Temp 98.6°F | Resp 20 | Ht 65.5 in | Wt 176.7 lb

## 2013-07-03 DIAGNOSIS — C50912 Malignant neoplasm of unspecified site of left female breast: Secondary | ICD-10-CM

## 2013-07-03 DIAGNOSIS — C50419 Malignant neoplasm of upper-outer quadrant of unspecified female breast: Secondary | ICD-10-CM

## 2013-07-03 DIAGNOSIS — Z170 Estrogen receptor positive status [ER+]: Secondary | ICD-10-CM

## 2013-07-03 DIAGNOSIS — R7989 Other specified abnormal findings of blood chemistry: Secondary | ICD-10-CM

## 2013-07-03 LAB — CBC WITH DIFFERENTIAL/PLATELET
BASO%: 0.7 % (ref 0.0–2.0)
EOS%: 2.5 % (ref 0.0–7.0)
LYMPH%: 26.5 % (ref 14.0–49.7)
MCHC: 33.8 g/dL (ref 31.5–36.0)
MCV: 94.5 fL (ref 79.5–101.0)
MONO%: 8.1 % (ref 0.0–14.0)
Platelets: 291 10*3/uL (ref 145–400)
RBC: 4.47 10*6/uL (ref 3.70–5.45)

## 2013-07-03 LAB — COMPREHENSIVE METABOLIC PANEL (CC13)
ALT: 75 U/L — ABNORMAL HIGH (ref 0–55)
AST: 59 U/L — ABNORMAL HIGH (ref 5–34)
Alkaline Phosphatase: 111 U/L (ref 40–150)
Sodium: 140 mEq/L (ref 136–145)
Total Bilirubin: 0.5 mg/dL (ref 0.20–1.20)
Total Protein: 7.7 g/dL (ref 6.4–8.3)

## 2013-07-03 NOTE — Progress Notes (Signed)
OFFICE PROGRESS NOTE  CC  BABAOFF, MARC E, MD [address] [place] [address] Dr. Leda Quail Dr. 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 The tumor was ER positive PR positive HER-2/neu negative.  #2 patient had BRCA1/2 testing and was negative.   #3 patient had Oncotype DX testing performed she was in the intermediate risk category therefore she was begun on adjuvant chemotherapy consisting of 4 cycles of Taxotere and Cytoxan.  #4 she then went on to have radiation therapy which was completed a 28 2011. Subsequently she was started on letrozole 2.5 mg daily.  CURRENT THERAPY:letrozole 2.5 mg daily  INTERVAL HISTORY: Marilyn Hansen 57 y.o. female returns for followup visit. Clinically she's doing well without any problems she has no evidence of recurrent disease. Recently patient had a mammogram that showed an abnormality in the right breast she underwent biopsy that was benign. Today patient feels well she has no nausea no vomiting no fevers chills night sweats she's tolerating letrozole without any problems. Her labs do show that her LFTs are elevated specifically the transaminases. But she is without any abdominal pain she has no changes in her bowel or bladder habits. Remainder of the 10 point review of systems is negative.  MEDICAL HISTORY: Past Medical History  Diagnosis Date  . H/O left breast biopsy 2/07    intraductal papilloma with atypical ductal hyperplasia  . Breast cancer 1/08    invasive breast cancer (T2, N1 ER+/PR+) BRCA 1/11 negative  . Hypertension     ALLERGIES:  is allergic to dayquil.  MEDICATIONS:  Current Outpatient Prescriptions  Medication Sig Dispense Refill  . Ascorbic Acid (VITAMIN C PO) Take by mouth daily.      Marland Kitchen aspirin 81 MG tablet Take by mouth  daily.       . Cholecalciferol (VITAMIN D PO) Take 1 each by mouth 2 (two) times a week.       . clonazePAM (KLONOPIN) 0.5 MG tablet Take 1 tablet (0.5 mg total) by mouth 2 (two) times daily as needed.  30 tablet  5  . escitalopram (LEXAPRO) 10 MG tablet 10 mg daily.      . Flaxseed, Linseed, (FLAX SEED OIL PO) Take by mouth daily.      . hydrochlorothiazide (HYDRODIURIL) 25 MG tablet Take 25 mg by mouth daily.      Marland Kitchen letrozole (FEMARA) 2.5 MG tablet Take 1 tablet (2.5 mg total) by mouth daily.  90 tablet  3  . lovastatin (MEVACOR) 20 MG tablet Take 20 mg by mouth at bedtime.      . Multiple Vitamins-Minerals (MULTIVITAMIN PO) Take by mouth daily.      . Omega-3 Fatty Acids (FISH OIL PO) Take by mouth daily.       No current facility-administered medications for this visit.    SURGICAL HISTORY:  Past Surgical History  Procedure Laterality Date  . Breast enhancement surgery  2003    saline  . Breast biopsy  2/07    breast excisional biopsy/chemo  . Bilateral salpingoophorectomy  6/10    l  . Wisdom tooth extraction    . Breast biopsy  1/14 or 2/14    right-benign    REVIEW OF SYSTEMS:  Pertinent items are noted in HPI.   HEALTH MAINTENANCE:   PHYSICAL EXAMINATION: Blood  pressure 123/79, pulse 86, temperature 98.6 F (37 C), temperature source Oral, resp. rate 20, height 5' 5.5" (1.664 m), weight 176 lb 11.2 oz (80.151 kg), last menstrual period 11/21/2006. Body mass index is 28.95 kg/(m^2). ECOG PERFORMANCE STATUS: 0 - Asymptomatic Well-developed nourished female in no acute distress HEENT exam: EOMI PERRLA sclerae anicteric no conjunctival pallor oral mucosa is moist neck is supple lungs clear Cardiovascular regular rate rhythm Abdomen soft nontender nondistended bowel sounds present no HSM Extremities no edema Neuro nonfocal Breast exam left breast well-healed surgical scar no masses nipple discharge   LABORATORY DATA: Lab Results  Component Value Date   WBC 7.8  07/03/2013   HGB 14.3 07/03/2013   HCT 42.2 07/03/2013   MCV 94.5 07/03/2013   PLT 291 07/03/2013      Chemistry      Component Value Date/Time   NA 140 07/03/2013 1356   NA 141 12/08/2011 1608   K 3.5 07/03/2013 1356   K 3.7 12/08/2011 1608   CL 102 12/26/2012 1613   CL 101 12/08/2011 1608   CO2 27 07/03/2013 1356   CO2 29 12/08/2011 1608   BUN 13.7 07/03/2013 1356   BUN 17 12/08/2011 1608   CREATININE 0.8 07/03/2013 1356   CREATININE 0.70 12/08/2011 1608      Component Value Date/Time   CALCIUM 10.6* 07/03/2013 1356   CALCIUM 10.4 12/08/2011 1608   ALKPHOS 111 07/03/2013 1356   ALKPHOS 101 12/08/2011 1608   AST 59* 07/03/2013 1356   AST 23 12/08/2011 1608   ALT 75* 07/03/2013 1356   ALT 26 12/08/2011 1608   BILITOT 0.50 07/03/2013 1356   BILITOT 0.3 12/08/2011 1608       RADIOGRAPHIC STUDIES:  No results found.  ASSESSMENT: 57 year old female with  #1 history of stage II (T2 N1) invasive ductal carcinoma of the left breast status post lumpectomy with sentinel lymph node biopsy. Patient is status post adjuvant chemotherapy consisting of Taxotere Cytoxan x4 cycles. Followed by radiation which she completed a 28 2011. She subsequently was started on letrozole 2.5 mg daily. She remains on it. She is tolerating it well. She has no evidence of recurrent disease.  #2 elevated LFTs: Could be due to her cholesterol medication versus other causes. I do think that we need to be repeated. She is gong to be seeing her primary care physician in January for followup. She was told that her cholesterol was significantly increased.  PLAN:   #1 patient will continue letrozole 2.5 mg daily.  #2 patient will followup with her primary care physician regarding her liver function studies. We discussed repeating.LFTs in January when she sees her PCP. If they are still elevated then she may need further evaluation such as ultrasound.  #3 she will be seen back in 6 months time for followup.   All  questions were answered. The patient knows to call the clinic with any problems, questions or concerns. We can certainly see the patient much sooner if necessary.  I spent 15 minutes counseling the patient face to face. The total time spent in the appointment was 25 minutes.    Drue Second, MD Medical/Oncology [facility] [phone] (beeper) [phone] (Office)

## 2013-07-03 NOTE — Telephone Encounter (Signed)
, °

## 2013-07-09 ENCOUNTER — Other Ambulatory Visit: Payer: Self-pay | Admitting: Obstetrics & Gynecology

## 2013-07-09 NOTE — Telephone Encounter (Signed)
Rx was given on Annual Exam 12/19/12 #30 x 5 refills please advise.

## 2013-07-10 NOTE — Telephone Encounter (Signed)
Rx called to [facility] Klonopin 0.5mg  #30 x 5 refills.

## 2013-07-10 NOTE — Telephone Encounter (Signed)
This is ok to RF.  As I am out of office will need to be called in.  RX done but I put in "phone in".  #30/5 RF.

## 2013-07-30 ENCOUNTER — Other Ambulatory Visit: Payer: Self-pay | Admitting: Family

## 2013-08-05 ENCOUNTER — Other Ambulatory Visit: Payer: Self-pay | Admitting: *Deleted

## 2013-08-05 DIAGNOSIS — C50919 Malignant neoplasm of unspecified site of unspecified female breast: Secondary | ICD-10-CM

## 2013-08-05 DIAGNOSIS — E559 Vitamin D deficiency, unspecified: Secondary | ICD-10-CM

## 2013-08-05 MED ORDER — LETROZOLE 2.5 MG PO TABS: 2.5000 mg | ORAL_TABLET | Freq: Every day | ORAL | Status: DC

## 2013-08-26 ENCOUNTER — Other Ambulatory Visit: Payer: Self-pay | Admitting: Oncology

## 2013-08-26 DIAGNOSIS — Z853 Personal history of malignant neoplasm of breast: Secondary | ICD-10-CM

## 2013-09-25 ENCOUNTER — Ambulatory Visit
Admission: RE | Admit: 2013-09-25 | Discharge: 2013-09-25 | Disposition: A | Discharge status: Home / Self Care | Payer: 59 | Source: Ambulatory Visit | Attending: Oncology | Admitting: Oncology

## 2013-09-25 ENCOUNTER — Ambulatory Visit: Payer: 59

## 2013-09-25 DIAGNOSIS — Z853 Personal history of malignant neoplasm of breast: Secondary | ICD-10-CM

## 2013-10-11 ENCOUNTER — Telehealth: Payer: Self-pay | Admitting: Oncology

## 2013-10-11 NOTE — Telephone Encounter (Signed)
, °

## 2013-10-22 ENCOUNTER — Other Ambulatory Visit: Payer: Self-pay | Admitting: Oncology

## 2014-01-08 ENCOUNTER — Other Ambulatory Visit: Payer: Self-pay | Admitting: Oncology

## 2014-01-08 DIAGNOSIS — C50912 Malignant neoplasm of unspecified site of left female breast: Secondary | ICD-10-CM

## 2014-01-13 ENCOUNTER — Ambulatory Visit: Payer: 59 | Admitting: Oncology

## 2014-01-13 ENCOUNTER — Other Ambulatory Visit: Payer: 59

## 2014-01-16 ENCOUNTER — Ambulatory Visit (HOSPITAL_BASED_OUTPATIENT_CLINIC_OR_DEPARTMENT_OTHER): Payer: 59 | Admitting: Adult Health

## 2014-01-16 ENCOUNTER — Encounter: Payer: Self-pay | Admitting: Adult Health

## 2014-01-16 ENCOUNTER — Other Ambulatory Visit (HOSPITAL_BASED_OUTPATIENT_CLINIC_OR_DEPARTMENT_OTHER): Payer: 59

## 2014-01-16 VITALS — BP 122/75 | HR 80 | Temp 98.4°F | Resp 18 | Ht 65.5 in | Wt 180.0 lb

## 2014-01-16 DIAGNOSIS — Z79811 Long term (current) use of aromatase inhibitors: Secondary | ICD-10-CM

## 2014-01-16 DIAGNOSIS — Z170 Estrogen receptor positive status [ER+]: Secondary | ICD-10-CM

## 2014-01-16 DIAGNOSIS — C50412 Malignant neoplasm of upper-outer quadrant of left female breast: Secondary | ICD-10-CM | POA: Insufficient documentation

## 2014-01-16 DIAGNOSIS — C50912 Malignant neoplasm of unspecified site of left female breast: Secondary | ICD-10-CM

## 2014-01-16 DIAGNOSIS — C50419 Malignant neoplasm of upper-outer quadrant of unspecified female breast: Secondary | ICD-10-CM

## 2014-01-16 DIAGNOSIS — C50919 Malignant neoplasm of unspecified site of unspecified female breast: Secondary | ICD-10-CM

## 2014-01-16 LAB — COMPREHENSIVE METABOLIC PANEL (CC13)
ALBUMIN: 3.9 g/dL (ref 3.5–5.0)
ALT: 19 U/L (ref 0–55)
AST: 18 U/L (ref 5–34)
Alkaline Phosphatase: 97 U/L (ref 40–150)
Anion Gap: 11 mEq/L (ref 3–11)
BUN: 19.3 mg/dL (ref 7.0–26.0)
CALCIUM: 9.9 mg/dL (ref 8.4–10.4)
CHLORIDE: 109 meq/L (ref 98–109)
CO2: 22 mEq/L (ref 22–29)
Creatinine: 0.8 mg/dL (ref 0.6–1.1)
Glucose: 101 mg/dl (ref 70–140)
POTASSIUM: 3.9 meq/L (ref 3.5–5.1)
Sodium: 142 mEq/L (ref 136–145)
TOTAL PROTEIN: 7.1 g/dL (ref 6.4–8.3)
Total Bilirubin: 0.24 mg/dL (ref 0.20–1.20)

## 2014-01-16 LAB — CBC WITH DIFFERENTIAL/PLATELET
BASO%: 0.4 % (ref 0.0–2.0)
Basophils Absolute: 0 10*3/uL (ref 0.0–0.1)
EOS%: 2.5 % (ref 0.0–7.0)
Eosinophils Absolute: 0.2 10*3/uL (ref 0.0–0.5)
HCT: 38.7 % (ref 34.8–46.6)
HEMOGLOBIN: 12.8 g/dL (ref 11.6–15.9)
LYMPH#: 2.2 10*3/uL (ref 0.9–3.3)
LYMPH%: 28.5 % (ref 14.0–49.7)
MCH: 29.8 pg (ref 25.1–34.0)
MCHC: 33.1 g/dL (ref 31.5–36.0)
MCV: 90 fL (ref 79.5–101.0)
MONO#: 0.6 10*3/uL (ref 0.1–0.9)
MONO%: 8.3 % (ref 0.0–14.0)
NEUT#: 4.7 10*3/uL (ref 1.5–6.5)
NEUT%: 60.3 % (ref 38.4–76.8)
Platelets: 287 10*3/uL (ref 145–400)
RBC: 4.3 10*6/uL (ref 3.70–5.45)
RDW: 13.2 % (ref 11.2–14.5)
WBC: 7.8 10*3/uL (ref 3.9–10.3)

## 2014-01-16 NOTE — Patient Instructions (Signed)
You are doing well.  You have no sign of recurrence.  Continue taking Letrozole daily.  I recommend a healthy diet, self breast exams, and exercise.  We will see you back in 6 months.  Please call us if you have any questions or concerns.    Breast Self-Awareness Practicing breast self-awareness may pick up problems early, prevent significant medical complications, and possibly save your life. By practicing breast self-awareness, you can become familiar with how your breasts look and feel and if your breasts are changing. This allows you to notice changes early. It can also offer you some reassurance that your breast health is good. One way to learn what is normal for your breasts and whether your breasts are changing is to do a breast self-exam. If you find a lump or something that was not present in the past, it is best to contact your caregiver right away. Other findings that should be evaluated by your caregiver include nipple discharge, especially if it is bloody; skin changes or reddening; areas where the skin seems to be pulled in (retracted); or new lumps and bumps. Breast pain is seldom associated with cancer (malignancy), but should also be evaluated by a caregiver. HOW TO PERFORM A BREAST SELF-EXAM The best time to examine your breasts is 5 7 days after your menstrual period is over. During menstruation, the breasts are lumpier, and it may be more difficult to pick up changes. If you do not menstruate, have reached menopause, or had your uterus removed (hysterectomy), you should examine your breasts at regular intervals, such as monthly. If you are breastfeeding, examine your breasts after a feeding or after using a breast pump. Breast implants do not decrease the risk for lumps or tumors, so continue to perform breast self-exams as recommended. Talk to your caregiver about how to determine the difference between the implant and breast tissue. Also, talk about the amount of pressure you should use  during the exam. Over time, you will become more familiar with the variations of your breasts and more comfortable with the exam. A breast self-exam requires you to remove all your clothes above the waist. 1. Look at your breasts and nipples. Stand in front of a mirror in a room with good lighting. With your hands on your hips, push your hands firmly downward. Look for a difference in shape, contour, and size from one breast to the other (asymmetry). Asymmetry includes puckers, dips, or bumps. Also, look for skin changes, such as reddened or scaly areas on the breasts. Look for nipple changes, such as discharge, dimpling, repositioning, or redness. 2. Carefully feel your breasts. This is best done either in the shower or tub while using soapy water or when flat on your back. Place the arm (on the side of the breast you are examining) above your head. Use the pads (not the fingertips) of your three middle fingers on your opposite hand to feel your breasts. Start in the underarm area and use  inch (2 cm) overlapping circles to feel your breast. Use 3 different levels of pressure (light, medium, and firm pressure) at each circle before moving to the next circle. The light pressure is needed to feel the tissue closest to the skin. The medium pressure will help to feel breast tissue a little deeper, while the firm pressure is needed to feel the tissue close to the ribs. Continue the overlapping circles, moving downward over the breast until you feel your ribs below your breast. Then, move one  finger-width towards the center of the body. Continue to use the  inch (2 cm) overlapping circles to feel your breast as you move slowly up toward the collar bone (clavicle) near the base of the neck. Continue the up and down exam using all 3 pressures until you reach the middle of the chest. Do this with each breast, carefully feeling for lumps or changes. 3.  Keep a written record with breast changes or normal findings for  each breast. By writing this information down, you do not need to depend only on memory for size, tenderness, or location. Write down where you are in your menstrual cycle, if you are still menstruating. Breast tissue can have some lumps or thick tissue. However, see your caregiver if you find anything that concerns you.  SEEK MEDICAL CARE IF:  You see a change in shape, contour, or size of your breasts or nipples.   You see skin changes, such as reddened or scaly areas on the breasts or nipples.   You have an unusual discharge from your nipples.   You feel a new lump or unusually thick areas.  Document Released: 08/08/2005 Document Revised: 07/25/2012 Document Reviewed: 11/23/2011 [facility] Patient Information 2014 [place].

## 2014-01-16 NOTE — Progress Notes (Signed)
OFFICE PROGRESS NOTE  CC  BABAOFF, Caryl Bis, MD [place] [id number] Dr. Edwinna Areola Dr. 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 The tumor was ER positive PR positive HER-2/neu negative.  #2 patient had BRCA1/2 testing and was negative.   #3 patient had Oncotype DX testing performed she was in the intermediate risk category therefore she was begun on adjuvant chemotherapy consisting of 4 cycles of Taxotere and Cytoxan.  #4 she then went on to have radiation therapy which was completed 2009. Subsequently she was started on letrozole 2.5 mg daily.  CURRENT THERAPY:letrozole 2.5 mg daily  INTERVAL HISTORY: Marilyn Hansen 58 y.o. female returns for followup visit for her h/o stage II breast cancer.  She has occasional joint aches, and an intermittent hot flash.  Otherwise she is tolerating the Letrozole without much difficulty at all.  She denies fevers chills, weight loss, new pain, or any further concerns.  We reviewed her heatlh maintenance below.   MEDICAL HISTORY: Past Medical History  Diagnosis Date  . H/O left breast biopsy 2/07    intraductal papilloma with atypical ductal hyperplasia  . Breast cancer 1/08    invasive breast cancer (T2, N1 ER+/PR+) BRCA 1/11 negative  . Hypertension     ALLERGIES:  is allergic to dayquil.  MEDICATIONS:  Current Outpatient Prescriptions  Medication Sig Dispense Refill  . Ascorbic Acid (VITAMIN C PO) Take by mouth daily.      Marland Kitchen aspirin 81 MG tablet Take by mouth daily.       Marland Kitchen escitalopram (LEXAPRO) 10 MG tablet 10 mg daily.      . Flaxseed, Linseed, (FLAX SEED OIL PO) Take by mouth daily.      Marland Kitchen letrozole (FEMARA) 2.5 MG tablet Take 1 tablet by mouth  daily  90 tablet  0  . lovastatin (MEVACOR) 20 MG tablet Take 20 mg by mouth at bedtime.       . Multiple Vitamins-Minerals (MULTIVITAMIN PO) Take by mouth daily.      . Omega-3 Fatty Acids (FISH OIL PO) Take by mouth daily.      . clonazePAM (KLONOPIN) 0.5 MG tablet TAKE 1 TABLET BY MOUTH DAILY AS NEEDED       No current facility-administered medications for this visit.    SURGICAL HISTORY:  Past Surgical History  Procedure Laterality Date  . Breast enhancement surgery  2003    saline  . Breast biopsy  2/07    breast excisional biopsy/chemo  . Bilateral salpingoophorectomy  6/10    l  . Wisdom tooth extraction    . Breast biopsy  1/14 or 2/14    right-benign    REVIEW OF SYSTEMS:  A 10 point review of systems was conducted and is otherwise negative except for what is noted above.     HEALTH MAINTENANCE:  Mammogram: 03/2013 Colonoscopy: has had, cannot recall Bone Density Scan: 05/08/2013 Pap Smear: 2014 Eye Exam: 2013 Vitamin D Level: normal in 12/2012 Lipid Panel: 2015   PHYSICAL EXAMINATION: Blood pressure 122/75, pulse 80, temperature 98.4 F (36.9 C), temperature source Oral, resp. rate 18, height 5' 5.5" (1.664 m), weight 180 lb (81.647 kg), last menstrual period 11/21/2006. Body mass index is 29.49 kg/(m^2). GENERAL: Patient is a well appearing female in no acute distress HEENT:  Sclerae anicteric.  Oropharynx clear and moist. No ulcerations or evidence of oropharyngeal candidiasis. Neck is supple.  NODES:  No cervical, supraclavicular, or axillary lymphadenopathy palpated.  BREAST EXAM:  LUNGS:  Clear to auscultation bilaterally.  No wheezes or rhonchi. HEART:  Regular rate and rhythm. No murmur appreciated. ABDOMEN:  Soft, nontender.  Positive, normoactive bowel sounds. No organomegaly palpated. MSK:  No focal spinal tenderness to palpation. Full range of motion bilaterally in the upper extremities. EXTREMITIES:  No peripheral edema.   SKIN:  Clear with no obvious rashes or skin changes. No nail dyscrasia. NEURO:  Nonfocal. Well oriented.  Appropriate  affect. ECOG PERFORMANCE STATUS: 0 - Asymptomatic     LABORATORY DATA: Lab Results  Component Value Date   WBC 7.8 01/16/2014   HGB 12.8 01/16/2014   HCT 38.7 01/16/2014   MCV 90.0 01/16/2014   PLT 287 01/16/2014      Chemistry      Component Value Date/Time   NA 142 01/16/2014 1500   NA 141 12/08/2011 1608   K 3.9 01/16/2014 1500   K 3.7 12/08/2011 1608   CL 102 12/26/2012 1613   CL 101 12/08/2011 1608   CO2 22 01/16/2014 1500   CO2 29 12/08/2011 1608   BUN 19.3 01/16/2014 1500   BUN 17 12/08/2011 1608   CREATININE 0.8 01/16/2014 1500   CREATININE 0.70 12/08/2011 1608      Component Value Date/Time   CALCIUM 9.9 01/16/2014 1500   CALCIUM 10.4 12/08/2011 1608   ALKPHOS 97 01/16/2014 1500   ALKPHOS 101 12/08/2011 1608   AST 18 01/16/2014 1500   AST 23 12/08/2011 1608   ALT 19 01/16/2014 1500   ALT 26 12/08/2011 1608   BILITOT 0.24 01/16/2014 1500   BILITOT 0.3 12/08/2011 1608       RADIOGRAPHIC STUDIES:  No results found.  ASSESSMENT: 58 year old female with  #1 history of stage II (T2 N1) invasive ductal carcinoma of the left breast status post lumpectomy with sentinel lymph node biopsy. Patient is status post adjuvant chemotherapy consisting of Taxotere Cytoxan x4 cycles. Followed by radiation which she completed in 2009. She subsequently was started on letrozole 2.5 mg daily. She remains on it. She is tolerating it well. She has no evidence of recurrent disease.   PLAN:   #1 Patient is doing well.  She has no sign of recurrence.  She is tolerating Letrozole daily without difficulty and will continue this.  She has been on this medication since 2011 and was under the impression that she would stay on Letrozole for 10 years.  We reviewed her health maintenance above.  I discussed survivorship with her today and recommended healthy diet, exercise and monthly breast exams.  The patient will return in 6 months for labs and evaluation.  All questions were answered. The patient knows to  call the clinic with any problems, questions or concerns. We can certainly see the patient much sooner if necessary.  I spent 25 minutes counseling the patient face to face. The total time spent in the appointment was 30 minutes.   Minette Headland, [place] [phone]

## 2014-01-22 ENCOUNTER — Telehealth: Payer: Self-pay | Admitting: Hematology and Oncology

## 2014-01-22 NOTE — Telephone Encounter (Signed)
lmonvm advising the pt of her 6 mnth f/u appt for labs and md visit

## 2014-02-20 ENCOUNTER — Other Ambulatory Visit: Payer: Self-pay | Admitting: Adult Health

## 2014-03-26 ENCOUNTER — Ambulatory Visit: Payer: 59 | Admitting: Obstetrics & Gynecology

## 2014-03-28 ENCOUNTER — Ambulatory Visit (INDEPENDENT_AMBULATORY_CARE_PROVIDER_SITE_OTHER): Payer: 59 | Admitting: Obstetrics & Gynecology

## 2014-03-28 ENCOUNTER — Encounter: Payer: Self-pay | Admitting: Obstetrics & Gynecology

## 2014-03-28 VITALS — BP 104/64 | HR 64 | Resp 16 | Ht 65.25 in | Wt 174.2 lb

## 2014-03-28 DIAGNOSIS — Z124 Encounter for screening for malignant neoplasm of cervix: Secondary | ICD-10-CM

## 2014-03-28 DIAGNOSIS — Z0000 Encounter for general adult medical examination without abnormal findings: Secondary | ICD-10-CM

## 2014-03-28 DIAGNOSIS — Z01419 Encounter for gynecological examination (general) (routine) without abnormal findings: Secondary | ICD-10-CM

## 2014-03-28 LAB — POCT URINALYSIS DIPSTICK
Bilirubin, UA: NEGATIVE
Blood, UA: NEGATIVE
Glucose, UA: NEGATIVE
Ketones, UA: NEGATIVE
Leukocytes, UA: NEGATIVE
Nitrite, UA: NEGATIVE
PH UA: 5
PROTEIN UA: NEGATIVE
UROBILINOGEN UA: NEGATIVE

## 2014-03-28 LAB — HEMOGLOBIN, FINGERSTICK: Hemoglobin, fingerstick: 13.9 g/dL (ref 12.0–16.0)

## 2014-03-28 MED ORDER — CLONAZEPAM 0.5 MG PO TABS: ORAL_TABLET | ORAL | Status: DC

## 2014-03-28 NOTE — Progress Notes (Signed)
58 y.o. G0P0000 MarriedCaucasianF here for annual exam.  No vaginal bleeding.  Doing well.    Patient's last menstrual period was 11/21/2006.          Sexually active: Yes.    The current method of family planning is vasectomy.    Exercising: Yes.    somewhat Smoker:  no  Health Maintenance: Pap: 11/16/11 WNL/negative HR HPV  MMG: 2/15 Colonoscopy: 5/09 repeat 5-10 years  BMD: 8/12  TDaP: 4/14  Labs: Dr[facility] Baldemar Lenis [facility]. Last year.  Screening Labs: will return for fasting lipids, Hb today: n/a, Urine today: neg   reports that she has never smoked. She has never used smokeless tobacco. She reports that she does not drink alcohol or use illicit drugs.  Past Medical History  Diagnosis Date  . H/O left breast biopsy 2/07    intraductal papilloma with atypical ductal hyperplasia  . Breast cancer 1/08    invasive breast cancer (T2, N1 ER+/PR+) BRCA 1/11 negative  . Hypertension     Past Surgical History  Procedure Laterality Date  . Breast enhancement surgery  2003    saline  . Breast biopsy  2/07    breast excisional biopsy/chemo  . Bilateral salpingoophorectomy  6/10    l  . Wisdom tooth extraction    . Breast biopsy  1/14 or 2/14    right-benign    Current Outpatient Prescriptions  Medication Sig Dispense Refill  . Ascorbic Acid (VITAMIN C PO) Take by mouth daily.      Marland Kitchen aspirin 81 MG tablet Take by mouth daily.       . clonazePAM (KLONOPIN) 0.5 MG tablet TAKE 1 TABLET BY MOUTH DAILY AS NEEDED      . escitalopram (LEXAPRO) 10 MG tablet 10 mg daily.      . Flaxseed, Linseed, (FLAX SEED OIL PO) Take by mouth daily.      Marland Kitchen letrozole (FEMARA) 2.5 MG tablet Take 1 tablet by mouth  daily  90 tablet  4  . lovastatin (MEVACOR) 20 MG tablet Take 20 mg by mouth at bedtime.      . Multiple Vitamins-Minerals (MULTIVITAMIN PO) Take by mouth daily.      . Omega-3 Fatty Acids (FISH OIL PO) Take by mouth daily.      . traZODone (DESYREL) 50 MG tablet        No current  facility-administered medications for this visit.    Family History  Problem Relation Age of Onset  . Hypertension Maternal Grandmother   . Hypertension Paternal Grandmother   . Heart disease Paternal Grandmother   . Heart disease Maternal Grandmother   . Alzheimer's disease Father   . Anemia Mother   . Osteoporosis Mother   . Alcohol abuse Mother     ROS:  Pertinent items are noted in HPI.  Otherwise, a comprehensive ROS was negative.  Exam:   BP 104/64  Pulse 64  Resp 16  Ht 5' 5.25" (1.657 m)  Wt 174 lb 3.2 oz (79.017 kg)  BMI 28.78 kg/m2  LMP 11/21/2006   Height: 5' 5.25" (165.7 cm)  Ht Readings from Last 3 Encounters:  03/28/14 5' 5.25" (1.657 m)  01/16/14 5' 5.5" (1.664 m)  07/03/13 5' 5.5" (1.664 m)    General appearance: alert, cooperative and appears stated age Head: Normocephalic, without obvious abnormality, atraumatic Neck: no adenopathy, supple, symmetrical, trachea midline and thyroid normal to inspection and palpation Lungs: clear to auscultation bilaterally Breasts: normal appearance, no masses or tenderness,  with well healed scars on left breast Heart: regular rate and rhythm Abdomen: soft, non-tender; bowel sounds normal; no masses,  no organomegaly Extremities: extremities normal, atraumatic, no cyanosis or edema Skin: Skin color, texture, turgor normal. No rashes or lesions Lymph nodes: Cervical, supraclavicular, and axillary nodes normal. No abnormal inguinal nodes palpated Neurologic: Grossly normal   Pelvic: External genitalia:  no lesions              Urethra:  normal appearing urethra with no masses, tenderness or lesions              Bartholins and Skenes: normal                 Vagina: normal appearing vagina with normal color and discharge, no lesions              Cervix: no lesions              Pap taken: No. Bimanual Exam:  Uterus:  normal size, contour, position, consistency, mobility, non-tender              Adnexa: normal adnexa and  no mass, fullness, tenderness               Rectovaginal: Confirms               Anus:  normal sphincter tone, no lesions  A:  Well Woman with normal exam  H/O breast cancer 1/08, still on Femara  Anxiety  Hypertension  Decreased libido--long term issues  Elevated lipids   P: Mammogram yearly. D/W pt 3D imaging. pap smear neg with neg HR HPV 3/13. No Pap today.  Labs with PCP. He writes rx for Lexapro, Mevacor, HCTZ.  Clonazepam 0.49m qd prn. #30/5RF. Pt has been on this long term.  Tdap today.  return annually or prn  An After Visit Summary was printed and given to the patient.

## 2014-03-28 NOTE — Patient Instructions (Signed)

## 2014-04-01 LAB — IPS PAP TEST WITH REFLEX TO HPV

## 2014-07-21 ENCOUNTER — Other Ambulatory Visit: Payer: 59

## 2014-07-23 ENCOUNTER — Other Ambulatory Visit: Payer: Self-pay

## 2014-07-23 DIAGNOSIS — C50919 Malignant neoplasm of unspecified site of unspecified female breast: Secondary | ICD-10-CM

## 2014-07-24 ENCOUNTER — Other Ambulatory Visit (HOSPITAL_BASED_OUTPATIENT_CLINIC_OR_DEPARTMENT_OTHER): Payer: 59

## 2014-07-24 DIAGNOSIS — C50919 Malignant neoplasm of unspecified site of unspecified female breast: Secondary | ICD-10-CM

## 2014-07-24 LAB — CBC WITH DIFFERENTIAL/PLATELET
BASO%: 0.7 % (ref 0.0–2.0)
Basophils Absolute: 0 10*3/uL (ref 0.0–0.1)
EOS%: 2.2 % (ref 0.0–7.0)
Eosinophils Absolute: 0.1 10*3/uL (ref 0.0–0.5)
HCT: 40.6 % (ref 34.8–46.6)
HGB: 13.2 g/dL (ref 11.6–15.9)
LYMPH%: 24.5 % (ref 14.0–49.7)
MCH: 30.3 pg (ref 25.1–34.0)
MCHC: 32.5 g/dL (ref 31.5–36.0)
MCV: 93.4 fL (ref 79.5–101.0)
MONO#: 0.6 10*3/uL (ref 0.1–0.9)
MONO%: 8.4 % (ref 0.0–14.0)
NEUT#: 4.4 10*3/uL (ref 1.5–6.5)
NEUT%: 64.2 % (ref 38.4–76.8)
PLATELETS: 270 10*3/uL (ref 145–400)
RBC: 4.35 10*6/uL (ref 3.70–5.45)
RDW: 14.2 % (ref 11.2–14.5)
WBC: 6.8 10*3/uL (ref 3.9–10.3)
lymph#: 1.7 10*3/uL (ref 0.9–3.3)

## 2014-07-24 LAB — COMPREHENSIVE METABOLIC PANEL (CC13)
ALK PHOS: 113 U/L (ref 40–150)
ALT: 19 U/L (ref 0–55)
AST: 19 U/L (ref 5–34)
Albumin: 4 g/dL (ref 3.5–5.0)
Anion Gap: 8 mEq/L (ref 3–11)
BILIRUBIN TOTAL: 0.39 mg/dL (ref 0.20–1.20)
BUN: 14.7 mg/dL (ref 7.0–26.0)
CO2: 27 mEq/L (ref 22–29)
Calcium: 10.3 mg/dL (ref 8.4–10.4)
Chloride: 106 mEq/L (ref 98–109)
Creatinine: 0.8 mg/dL (ref 0.6–1.1)
EGFR: 80 mL/min/{1.73_m2} — ABNORMAL LOW (ref >90)
Glucose: 99 mg/dl (ref 70–140)
Potassium: 3.9 mEq/L (ref 3.5–5.1)
SODIUM: 142 meq/L (ref 136–145)
TOTAL PROTEIN: 7.1 g/dL (ref 6.4–8.3)

## 2014-07-28 ENCOUNTER — Ambulatory Visit: Payer: 59 | Admitting: Hematology and Oncology

## 2014-07-31 ENCOUNTER — Telehealth: Payer: Self-pay | Admitting: Hematology and Oncology

## 2014-07-31 ENCOUNTER — Other Ambulatory Visit: Payer: 59

## 2014-07-31 ENCOUNTER — Other Ambulatory Visit: Payer: Self-pay | Admitting: Hematology and Oncology

## 2014-07-31 ENCOUNTER — Ambulatory Visit (HOSPITAL_BASED_OUTPATIENT_CLINIC_OR_DEPARTMENT_OTHER): Payer: 59 | Admitting: Hematology and Oncology

## 2014-07-31 DIAGNOSIS — Z853 Personal history of malignant neoplasm of breast: Secondary | ICD-10-CM

## 2014-07-31 DIAGNOSIS — M899 Disorder of bone, unspecified: Secondary | ICD-10-CM

## 2014-07-31 DIAGNOSIS — M8580 Other specified disorders of bone density and structure, unspecified site: Secondary | ICD-10-CM

## 2014-07-31 DIAGNOSIS — C50912 Malignant neoplasm of unspecified site of left female breast: Secondary | ICD-10-CM

## 2014-07-31 NOTE — Telephone Encounter (Signed)
per pof to sch pt appt-sch pt DEXA-gave pt copy of sch °

## 2014-07-31 NOTE — Progress Notes (Signed)
Patient Care Team: Kathyrn Lass, MD as PCP - General (Family Medicine)  DIAGNOSIS: Breast cancer   Staging form: Breast, AJCC 7th Edition     Clinical: No stage assigned - Unsigned     Pathologic: Stage IIA (T2, N0, cM0) - Signed by Minette Headland, NP on 01/16/2014   SUMMARY OF ONCOLOGIC HISTORY:   Breast cancer   09/29/2006 Surgery Left breast lumpectomy: Invasive ductal carcinoma 2.6 cm grade 21 SLN positive for micrometastatic disease 9 additional lymph nodes were negative, ER 99%, PR 90%, HER-2 1+, Ki-67 12% Oncotype DX intermediate risk    Procedure BRCA1 and 2 testing was done and it was negative   11/30/2006 - 04/01/2007 Chemotherapy Taxotere Cytoxan x4    Radiation Therapy Adjuvant radiation therapy   09/01/2007 -  Anti-estrogen oral therapy Tamoxifen from 2009 to 2011 then switched to Letrozole 2.5 mg daily plan is to take this indefinitely,     CHIEF COMPLIANT: followup of breast cancer  INTERVAL HISTORY: Marilyn Hansen is a 58 year old Caucasian with above-mentioned history of left-sided breast cancer treated with lumpectomy followed by adjuvant chemotherapy and radiation and she has been on oral antiestrogen therapy since 2009. Initially she was on 2 years of tamoxifen and then switched to letrozole after she had oophorectomy done. Immediately after oophorectomy, she had lots and lots of hot flashes but slowly did decrease over time. She is tolerating letrozole fairly well without any major problems. She prefers to stay on this medication indefinitely.  REVIEW OF SYSTEMS:   Constitutional: Denies fevers, chills or abnormal weight loss Eyes: Denies blurriness of vision Ears, nose, mouth, throat, and face: Denies mucositis or sore throat Respiratory: Denies cough, dyspnea or wheezes Cardiovascular: Denies palpitation, chest discomfort or lower extremity swelling Gastrointestinal:  Denies nausea, heartburn or change in bowel habits Skin: Denies abnormal skin rashes Lymphatics:  Denies new lymphadenopathy or easy bruising Neurological:Denies numbness, tingling or new weaknesses Behavioral/Psych: Mood is stable, no new changes  Breast:  denies any pain or lumps or nodules in either breasts All other systems were reviewed with the patient and are negative.  I have reviewed the past medical history, past surgical history, social history and family history with the patient and they are unchanged from previous note.  ALLERGIES:  is allergic to dayquil.  MEDICATIONS:  Current Outpatient Prescriptions  Medication Sig Dispense Refill  . Ascorbic Acid (VITAMIN C PO) Take by mouth daily.    Marland Kitchen aspirin 81 MG tablet Take by mouth daily.     . clonazePAM (KLONOPIN) 0.5 MG tablet TAKE 1 TABLET BY MOUTH DAILY AS NEEDED 30 tablet 2  . escitalopram (LEXAPRO) 10 MG tablet 10 mg daily.    . Flaxseed, Linseed, (FLAX SEED OIL PO) Take by mouth daily.    Marland Kitchen letrozole (FEMARA) 2.5 MG tablet Take 1 tablet by mouth  daily 90 tablet 4  . lovastatin (MEVACOR) 20 MG tablet Take 20 mg by mouth at bedtime.    . Multiple Vitamins-Minerals (MULTIVITAMIN PO) Take by mouth daily.    . Omega-3 Fatty Acids (FISH OIL PO) Take by mouth daily.    . traZODone (DESYREL) 50 MG tablet      No current facility-administered medications for this visit.    PHYSICAL EXAMINATION: ECOG PERFORMANCE STATUS: 1 - Symptomatic but completely ambulatory  Filed Vitals:   07/31/14 1039  BP: 137/76  Pulse: 92  Temp: 97.5 F (36.4 C)  Resp: 18   Filed Weights   07/31/14 1039  Weight: 179  lb 1.6 oz (81.239 kg)    GENERAL:alert, no distress and comfortable SKIN: skin color, texture, turgor are normal, no rashes or significant lesions EYES: normal, Conjunctiva are pink and non-injected, sclera clear OROPHARYNX:no exudate, no erythema and lips, buccal mucosa, and tongue normal  NECK: supple, thyroid normal size, non-tender, without nodularity LYMPH:  no palpable lymphadenopathy in the cervical, axillary or  inguinal LUNGS: clear to auscultation and percussion with normal breathing effort HEART: regular rate & rhythm and no murmurs and no lower extremity edema ABDOMEN:abdomen soft, non-tender and normal bowel sounds Musculoskeletal:no cyanosis of digits and no clubbing  NEURO: alert & oriented x 3 with fluent speech, no focal motor/sensory deficits BREAST: No palpable masses or nodules in either right or left breasts. No palpable axillary supraclavicular or infraclavicular adenopathy no breast tenderness or nipple discharge.   LABORATORY DATA:  I have reviewed the data as listed   Chemistry      Component Value Date/Time   NA 142 07/24/2014 1122   NA 141 12/08/2011 1608   K 3.9 07/24/2014 1122   K 3.7 12/08/2011 1608   CL 102 12/26/2012 1613   CL 101 12/08/2011 1608   CO2 27 07/24/2014 1122   CO2 29 12/08/2011 1608   BUN 14.7 07/24/2014 1122   BUN 17 12/08/2011 1608   CREATININE 0.8 07/24/2014 1122   CREATININE 0.70 12/08/2011 1608      Component Value Date/Time   CALCIUM 10.3 07/24/2014 1122   CALCIUM 10.4 12/08/2011 1608   ALKPHOS 113 07/24/2014 1122   ALKPHOS 101 12/08/2011 1608   AST 19 07/24/2014 1122   AST 23 12/08/2011 1608   ALT 19 07/24/2014 1122   ALT 26 12/08/2011 1608   BILITOT 0.39 07/24/2014 1122   BILITOT 0.3 12/08/2011 1608       Lab Results  Component Value Date   WBC 6.8 07/24/2014   HGB 13.2 07/24/2014   HCT 40.6 07/24/2014   MCV 93.4 07/24/2014   PLT 270 07/24/2014   NEUTROABS 4.4 07/24/2014     RADIOGRAPHIC STUDIES: I have personally reviewed the radiology reports and agreed with their findings. Mammograms done in February 2015 were normal  ASSESSMENT & PLAN:  Breast cancer Left breast invasive ductal carcinoma T2, N1, M0 stage IIB ER/PR positive HER-2 negative BRCA1 and 2 negative Oncotype DX intermediate risk received 4 cycles of Taxotere and Cytoxan followed by radiation that was completed in 2009 and started on tamoxifen for 2 years and  then letrozole 2.5 mg daily  Aromatase inhibitor toxicities: patient had a lot of hot flashes previously but no further problems with hot flashes currently. Her bone density test done in 2014 revealed mild osteopenia with a T score of -1.2. I recommended that she take vitamin D. Her serum calcium levels on the higher end of normal range. Patient fully understands that there is no data for extended adjuvant therapy with aromatase inhibitors and there are certainly risks to continuing these medications. Patient is still willing to continue with the treatment and hence I will continue with extended adjuvant therapy.  Surveillance: Mammograms done on February 2015 the reviewed and they were normal. I recommended continued annual breast exams and surveillance mammograms.  Survivorship: I discussed with her the importance of exercise 30 minutes daily. Also encouraged increasing fruits and vegetables and less red meat. Patient will stay on top of her cancer surveillance not only for breast cancer but also other cancers.   Orders Placed This Encounter  Procedures  . CBC  with Differential    Standing Status: Future     Number of Occurrences:      Standing Expiration Date: 07/31/2015  . Comprehensive metabolic panel (Cmet) - [facility]    Standing Status: Future     Number of Occurrences:      Standing Expiration Date: 07/31/2015   The patient has a good understanding of the overall plan. she agrees with it. She will call with any problems that may develop before her next visit here.   Rulon Eisenmenger, MD 07/31/2014 12:32 PM

## 2014-07-31 NOTE — Assessment & Plan Note (Signed)
Left breast invasive ductal carcinoma T2, N1, M0 stage IIB ER/PR positive HER-2 negative BRCA1 and 2 negative Oncotype DX intermediate risk received 4 cycles of Taxotere and Cytoxan followed by radiation that was completed in 2009 and started on tamoxifen for 2 years and then letrozole 2.5 mg daily  Aromatase inhibitor toxicities: patient had a lot of hot flashes previously but no further problems with hot flashes currently. Her bone density test done in 2014 revealed mild osteopenia with a T score of -1.2. I recommended that she take vitamin D. Her serum calcium levels on the higher end of normal range.  Surveillance: Mammograms done on February 2015 the reviewed and they were normal. I recommended continued annual breast exams and surveillance mammograms.  Survivorship: I discussed with her the importance of exercise 30 minutes daily. Also encouraged increasing fruits and vegetables and less red meat. Patient will stay on top of her cancer surveillance not only for breast cancer but also other cancers.

## 2014-08-19 ENCOUNTER — Other Ambulatory Visit: Payer: Self-pay | Admitting: Hematology and Oncology

## 2014-08-19 DIAGNOSIS — Z853 Personal history of malignant neoplasm of breast: Secondary | ICD-10-CM

## 2014-09-24 ENCOUNTER — Telehealth: Payer: Self-pay | Admitting: Hematology and Oncology

## 2014-09-24 NOTE — Telephone Encounter (Signed)
due to bmdc 12/14 appt moved to AM. s/w pt she is aware. due to pt driving schedule mailed per pt request.

## 2014-09-26 ENCOUNTER — Ambulatory Visit
Admission: RE | Admit: 2014-09-26 | Discharge: 2014-09-26 | Disposition: A | Discharge status: Home / Self Care | Payer: 59 | Source: Ambulatory Visit | Attending: Hematology and Oncology | Admitting: Hematology and Oncology

## 2014-09-26 DIAGNOSIS — Z853 Personal history of malignant neoplasm of breast: Secondary | ICD-10-CM

## 2014-10-30 ENCOUNTER — Other Ambulatory Visit: Payer: Self-pay | Admitting: Obstetrics & Gynecology

## 2014-10-31 NOTE — Telephone Encounter (Signed)
Medication refill request: Clonazepam 0.05 mg Last AEX:  03/28/14 with SM Next AEX: 04/21/15 with SM Last MMG (if hormonal medication request): N/A Refill authorized: #30/2 rfs, please advise.

## 2014-11-03 NOTE — Telephone Encounter (Signed)
Rx faxed to [facility]

## 2015-02-09 ENCOUNTER — Other Ambulatory Visit: Payer: Self-pay | Admitting: *Deleted

## 2015-02-09 DIAGNOSIS — C50912 Malignant neoplasm of unspecified site of left female breast: Secondary | ICD-10-CM

## 2015-02-09 MED ORDER — LETROZOLE 2.5 MG PO TABS: ORAL_TABLET | ORAL | Status: DC

## 2015-02-09 NOTE — Telephone Encounter (Signed)
TC from patient requesting refill on her Femara be changed from [facility] to [facility] in [place].  This was done and re-ordered.

## 2015-03-09 ENCOUNTER — Encounter: Payer: Self-pay | Admitting: Genetic Counselor

## 2015-04-21 ENCOUNTER — Encounter: Payer: Self-pay | Admitting: Obstetrics & Gynecology

## 2015-04-21 ENCOUNTER — Ambulatory Visit (INDEPENDENT_AMBULATORY_CARE_PROVIDER_SITE_OTHER): Payer: 59 | Admitting: Obstetrics & Gynecology

## 2015-04-21 VITALS — BP 110/68 | HR 64 | Resp 16 | Ht 65.0 in | Wt 185.0 lb

## 2015-04-21 DIAGNOSIS — Z01419 Encounter for gynecological examination (general) (routine) without abnormal findings: Secondary | ICD-10-CM | POA: Diagnosis not present

## 2015-04-21 DIAGNOSIS — Z1272 Encounter for screening for malignant neoplasm of vagina: Secondary | ICD-10-CM

## 2015-04-21 DIAGNOSIS — Z90712 Acquired absence of cervix with remaining uterus: Secondary | ICD-10-CM

## 2015-04-21 DIAGNOSIS — Z0000 Encounter for general adult medical examination without abnormal findings: Secondary | ICD-10-CM

## 2015-04-21 LAB — POCT URINALYSIS DIPSTICK
Bilirubin, UA: NEGATIVE
Blood, UA: NEGATIVE
GLUCOSE UA: NEGATIVE
Ketones, UA: NEGATIVE
Leukocytes, UA: NEGATIVE
NITRITE UA: NEGATIVE
PROTEIN UA: NEGATIVE
Urobilinogen, UA: NEGATIVE
pH, UA: 5

## 2015-04-21 LAB — HEMOGLOBIN, FINGERSTICK: HEMOGLOBIN, FINGERSTICK: 13.2 g/dL (ref 12.0–16.0)

## 2015-04-21 MED ORDER — CLONAZEPAM 0.5 MG PO TABS: 0.5000 mg | ORAL_TABLET | Freq: Every day | ORAL | Status: DC | PRN

## 2015-04-21 NOTE — Addendum Note (Signed)
Addended by: Megan Salon on: 04/21/2015 02:59 PM   Modules accepted: Miquel Dunn

## 2015-04-21 NOTE — Progress Notes (Addendum)
59 y.o. G0P0000 MarriedCaucasianF here for annual exam.  Doing well.  No vaginal bleeding.  Seeing Dr. Truddie Coco.    Patient's last menstrual period was 11/21/2006.          Sexually active: Yes.    The current method of family planning is vasectomy.    Exercising: No.  not regularly Smoker:  no  Health Maintenance: Pap:  03/28/14 WNL, neg HR HPV testing 2013 History of abnormal Pap:  no MMG:  09/26/14 3D-BiRads 2-Benign Colonoscopy:  5/09-repeat in 10 years BMD:   05/08/13, planned for this fall TDaP:  4/14 Screening Labs: Dr. Kathyrn Lass, Hb today: 13.2, Urine today: negative   reports that she has never smoked. She has never used smokeless tobacco. She reports that she does not drink alcohol or use illicit drugs.  Past Medical History  Diagnosis Date  . H/O left breast biopsy 2/07    intraductal papilloma with atypical ductal hyperplasia  . Breast cancer 1/08    invasive breast cancer (T2, N1 ER+/PR+) BRCA 1/11 negative  . Hypertension     Past Surgical History  Procedure Laterality Date  . Breast enhancement surgery  2003    saline  . Breast biopsy  2/07    breast excisional biopsy/chemo  . Bilateral salpingoophorectomy  6/10    l  . Wisdom tooth extraction    . Breast biopsy  1/14 or 2/14    right-benign    Current Outpatient Prescriptions  Medication Sig Dispense Refill  . Ascorbic Acid (VITAMIN C PO) Take by mouth daily.    Marland Kitchen aspirin 81 MG tablet Take by mouth daily.     . clonazePAM (KLONOPIN) 0.5 MG tablet TAKE 1 TABLET BY MOUTH DAILY AS NEEDED 30 tablet 2  . escitalopram (LEXAPRO) 10 MG tablet 10 mg daily.    . Flaxseed, Linseed, (FLAX SEED OIL PO) Take by mouth daily.    Marland Kitchen letrozole (FEMARA) 2.5 MG tablet Take 1 tablet by mouth  daily 90 tablet 4  . lovastatin (MEVACOR) 20 MG tablet Take 20 mg by mouth at bedtime.    . Multiple Vitamins-Minerals (MULTIVITAMIN PO) Take by mouth daily.    . Omega-3 Fatty Acids (FISH OIL PO) Take by mouth daily.    . traZODone  (DESYREL) 50 MG tablet      No current facility-administered medications for this visit.    Family History  Problem Relation Age of Onset  . Hypertension Maternal Grandmother   . Hypertension Paternal Grandmother   . Heart disease Paternal Grandmother   . Heart disease Maternal Grandmother   . Alzheimer's disease Father   . Anemia Mother   . Osteoporosis Mother   . Alcohol abuse Mother     ROS:  Pertinent items are noted in HPI.  Otherwise, a comprehensive ROS was negative.  Exam:   BP 110/68 mmHg  Pulse 64  Resp 16  Ht _0  (1.651 m)  Wt 185 lb (83.915 kg)  BMI 30.79 kg/m2  LMP 11/21/2006  Weight change:  +6# Height: _1  (165.1 cm)  Ht Readings from Last 3 Encounters:  04/21/15 _2  (1.651 m)  07/31/14 5' 5.25" (1.657 m)  03/28/14 5' 5.25" (1.657 m)    General appearance: alert, cooperative and appears stated age Head: Normocephalic, without obvious abnormality, atraumatic Neck: no adenopathy, supple, symmetrical, trachea midline and thyroid normal to inspection and palpation Lungs: clear to auscultation bilaterally Breasts: normal appearance, no masses or tenderness on right. Left breast with well healed lumpectomy  and axillary dissection scars.  No masses.  No skin changes, no LAD.  Stable from prior exam. Heart: regular rate and rhythm Abdomen: soft, non-tender; bowel sounds normal; no masses,  no organomegaly Extremities: extremities normal, atraumatic, no cyanosis or edema Skin: Skin color, texture, turgor normal. No rashes or lesions Lymph nodes: Cervical, supraclavicular, and axillary nodes normal. No abnormal inguinal nodes palpated Neurologic: Grossly normal   Pelvic: External genitalia:  no lesions              Urethra:  normal appearing urethra with no masses, tenderness or lesions              Bartholins and Skenes: normal                 Vagina: normal appearing vagina with normal color and discharge, no lesions              Cervix: no lesions               Pap taken: Yes.   Bimanual Exam:  Uterus:  normal size, contour, position, consistency, mobility, non-tender              Adnexa: normal adnexa and no mass, fullness, tenderness               Rectovaginal: Confirms               Anus:  normal sphincter tone, no lesions  Chaperone was present for exam.  A:  Well Woman with normal exam  H/O left invasive ductal breast cancer 1/08, still on Femara.  (lumpectomy, chemo, radiation.  Neg BRCA testing.  Intermediate oncotype). Anxiety  Hypertension  Decreased libido Sleep issues  Elevated lipids   P: Mammogram yearly. D/W pt 3D imaging. pap smear neg with neg HR HPV 3/13. Pap smear today. Labs with PCP. Clonazepam 0.31m qd prn. #30/3RF. Pt has been on this long term.  Return annually or prn

## 2015-04-22 LAB — IPS PAP TEST WITH REFLEX TO HPV

## 2015-05-13 ENCOUNTER — Other Ambulatory Visit: Payer: 59

## 2015-05-20 ENCOUNTER — Other Ambulatory Visit: Payer: 59

## 2015-06-10 ENCOUNTER — Inpatient Hospital Stay: Admission: RE | Admit: 2015-06-10 | Payer: 59 | Source: Ambulatory Visit

## 2015-07-10 ENCOUNTER — Ambulatory Visit
Admission: RE | Admit: 2015-07-10 | Discharge: 2015-07-10 | Disposition: A | Discharge status: Home / Self Care | Payer: 59 | Source: Ambulatory Visit | Attending: Hematology and Oncology | Admitting: Hematology and Oncology

## 2015-07-10 DIAGNOSIS — M8580 Other specified disorders of bone density and structure, unspecified site: Secondary | ICD-10-CM

## 2015-08-04 NOTE — Assessment & Plan Note (Signed)
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 hot flashes previously but no further problems with hot flashes currently.  2. Her bone density test done in 2014 revealed mild osteopenia with a T score of -1.2. I recommended that she take vitamin D.   Her serum calcium levels on the higher end of normal range. Plan is to continue Letrozole for 10 years.  Breast Cancer Surveillance: 1. Breast exam 08/05/15: Normal 2. Mammogram 09/26/14 No abnormalities. Postsurgical changes. Breast Density Category B. I recommended that she get 3-D mammograms for surveillance. Discussed the differences between different breast density categories. 3. Bone Density Nov 2016: T score -1.3 (Osteopenia)  RTC in 1 year

## 2015-08-05 ENCOUNTER — Ambulatory Visit (HOSPITAL_BASED_OUTPATIENT_CLINIC_OR_DEPARTMENT_OTHER): Payer: 59 | Admitting: Hematology and Oncology

## 2015-08-05 ENCOUNTER — Telehealth: Payer: Self-pay | Admitting: Hematology and Oncology

## 2015-08-05 ENCOUNTER — Encounter: Payer: Self-pay | Admitting: Hematology and Oncology

## 2015-08-05 VITALS — BP 141/70 | HR 96 | Temp 98.3°F | Resp 19 | Ht 65.0 in | Wt 185.3 lb

## 2015-08-05 DIAGNOSIS — M8580 Other specified disorders of bone density and structure, unspecified site: Secondary | ICD-10-CM | POA: Diagnosis not present

## 2015-08-05 DIAGNOSIS — C50412 Malignant neoplasm of upper-outer quadrant of left female breast: Secondary | ICD-10-CM | POA: Diagnosis not present

## 2015-08-05 NOTE — Progress Notes (Signed)
Patient Care Team: Kathyrn Lass, MD as PCP - General (Family Medicine)  DIAGNOSIS: Breast cancer of upper-outer quadrant of left female breast [facility])   Staging form: Breast, AJCC 7th Edition     Clinical: No stage assigned - Unsigned     Pathologic: Stage IIA (T2, N0, cM0) - Signed by Minette Headland, NP on 01/16/2014   SUMMARY OF ONCOLOGIC HISTORY:   Breast cancer of upper-outer quadrant of left female breast ([place])   09/29/2006 Surgery Left breast lumpectomy: Invasive ductal carcinoma 2.6 cm grade 21 SLN positive for micrometastatic disease 9 additional lymph nodes were negative, ER 99%, PR 90%, HER-2 1+, Ki-67 12% Oncotype DX intermediate risk    Procedure BRCA1 and 2 testing was done and it was negative   11/30/2006 - 04/01/2007 Chemotherapy Taxotere Cytoxan x4    Radiation Therapy Adjuvant radiation therapy   09/01/2007 -  Anti-estrogen oral therapy Tamoxifen from 2009 to 2011 then switched to Letrozole 2.5 mg daily plan is to take this indefinitely,     CHIEF COMPLIANT: follow-up on letrozole  INTERVAL HISTORY: Marilyn Hansen is a 59 year old lady with above-mentioned history left breast cancer was currently on antiestrogen therapy with letrozole. She is tolerating it extremely well. She does have occasional hot flashes but not significant. Denies any lumps or nodules in the breast. She had a mammogram February 2015 and is expecting to get another mammogram this February.  REVIEW OF SYSTEMS:   Constitutional: Denies fevers, chills or abnormal weight loss Eyes: Denies blurriness of vision Ears, nose, mouth, throat, and face: Denies mucositis or sore throat Respiratory: Denies cough, dyspnea or wheezes Cardiovascular: Denies palpitation, chest discomfort or lower extremity swelling Gastrointestinal:  Denies nausea, heartburn or change in bowel habits Skin: Denies abnormal skin rashes Lymphatics: Denies new lymphadenopathy or easy bruising Neurological:Denies numbness, tingling or new  weaknesses Behavioral/Psych: Mood is stable, no new changes  Breast:  denies any pain or lumps or nodules in either breasts All other systems were reviewed with the patient and are negative.  I have reviewed the past medical history, past surgical history, social history and family history with the patient and they are unchanged from previous note.  ALLERGIES:  is allergic to dayquil.  MEDICATIONS:  Current Outpatient Prescriptions  Medication Sig Dispense Refill  . Ascorbic Acid (VITAMIN C PO) Take by mouth daily.    Marland Kitchen aspirin 81 MG tablet Take by mouth daily.     . clonazePAM (KLONOPIN) 0.5 MG tablet Take 1 tablet (0.5 mg total) by mouth daily as needed. 30 tablet 2  . escitalopram (LEXAPRO) 10 MG tablet 10 mg daily.    . Flaxseed, Linseed, (FLAX SEED OIL PO) Take by mouth daily.    Marland Kitchen letrozole (FEMARA) 2.5 MG tablet Take 1 tablet by mouth  daily 90 tablet 4  . lovastatin (MEVACOR) 20 MG tablet Take 20 mg by mouth at bedtime.    . Multiple Vitamins-Minerals (MULTIVITAMIN PO) Take by mouth daily.    . Omega-3 Fatty Acids (FISH OIL PO) Take by mouth daily.    . traZODone (DESYREL) 50 MG tablet      No current facility-administered medications for this visit.    PHYSICAL EXAMINATION: ECOG PERFORMANCE STATUS: 0 - Asymptomatic  Filed Vitals:   08/05/15 0956  BP: 141/70  Pulse: 96  Temp: 98.3 F (36.8 C)  Resp: 19   Filed Weights   08/05/15 0956  Weight: 185 lb 4.8 oz (84.052 kg)    GENERAL:alert, no distress and  comfortable SKIN: skin color, texture, turgor are normal, no rashes or significant lesions EYES: normal, Conjunctiva are pink and non-injected, sclera clear OROPHARYNX:no exudate, no erythema and lips, buccal mucosa, and tongue normal  NECK: supple, thyroid normal size, non-tender, without nodularity LYMPH:  no palpable lymphadenopathy in the cervical, axillary or inguinal LUNGS: clear to auscultation and percussion with normal breathing effort HEART: soft  ejection systolic murmur in the mitral area ABDOMEN:abdomen soft, non-tender and normal bowel sounds Musculoskeletal:no cyanosis of digits and no clubbing  NEURO: alert & oriented x 3 with fluent speech, no focal motor/sensory deficits BREAST: No palpable masses or nodules in either right or left breasts. No palpable axillary supraclavicular or infraclavicular adenopathy no breast tenderness or nipple discharge. (exam performed in the presence of a chaperone)  LABORATORY DATA:  I have reviewed the data as listed   Chemistry      Component Value Date/Time   NA 142 07/24/2014 1122   NA 141 12/08/2011 1608   K 3.9 07/24/2014 1122   K 3.7 12/08/2011 1608   CL 102 12/26/2012 1613   CL 101 12/08/2011 1608   CO2 27 07/24/2014 1122   CO2 29 12/08/2011 1608   BUN 14.7 07/24/2014 1122   BUN 17 12/08/2011 1608   CREATININE 0.8 07/24/2014 1122   CREATININE 0.70 12/08/2011 1608      Component Value Date/Time   CALCIUM 10.3 07/24/2014 1122   CALCIUM 10.4 12/08/2011 1608   ALKPHOS 113 07/24/2014 1122   ALKPHOS 101 12/08/2011 1608   AST 19 07/24/2014 1122   AST 23 12/08/2011 1608   ALT 19 07/24/2014 1122   ALT 26 12/08/2011 1608   BILITOT 0.39 07/24/2014 1122   BILITOT 0.3 12/08/2011 1608       Lab Results  Component Value Date   WBC 6.8 07/24/2014   HGB 13.2 07/24/2014   HCT 40.6 07/24/2014   MCV 93.4 07/24/2014   PLT 270 07/24/2014   NEUTROABS 4.4 07/24/2014   ASSESSMENT & PLAN:  Breast cancer of upper-outer quadrant of left female breast ([place]) Left breast invasive ductal carcinoma T2, N1, M0 stage IIB ER/PR positive HER-2 negative BRCA1 and 2 negative Oncotype DX intermediate risk received 4 cycles of Taxotere and Cytoxan followed by radiation that was completed in 2009 and started on tamoxifen for 2 years and then letrozole 2.5 mg daily  Aromatase inhibitor toxicities: 1. hot flashes previously but not severe.  2. Her bone density test done in 2014 revealed mild osteopenia  with a T score of -1.2. I recommended that she take vitamin D. Repeat bone density test November 2016 showed a T score of -1.3 (osteopenia).  Her serum calcium levels on the higher end of normal range. Plan is to continue Letrozole for 10 years.  Breast Cancer Surveillance: 1. Breast exam 08/05/15: Normal 2. Mammogram 09/26/14 No abnormalities. Postsurgical changes. Breast Density Category B. I recommended that she get 3-D mammograms for surveillance. Discussed the differences between different breast density categories.  I encouraged her to eat more fruits and vegetables and exercise regularly.  RTC in 1 year   No orders of the defined types were placed in this encounter.   The patient has a good understanding of the overall plan. she agrees with it. she will call with any problems that may develop before the next visit here.   Rulon Eisenmenger, MD 08/05/2015

## 2015-08-05 NOTE — Telephone Encounter (Signed)
Gave patient avs report and appointments for December 2017.

## 2015-08-05 NOTE — Telephone Encounter (Signed)
Contacted pcp and requested [facility] autho.(tina)

## 2015-09-03 ENCOUNTER — Other Ambulatory Visit: Payer: Self-pay

## 2015-09-03 DIAGNOSIS — Z1231 Encounter for screening mammogram for malignant neoplasm of breast: Secondary | ICD-10-CM

## 2015-09-30 ENCOUNTER — Ambulatory Visit
Admission: RE | Admit: 2015-09-30 | Discharge: 2015-09-30 | Disposition: A | Discharge status: Home / Self Care | Payer: 59 | Source: Ambulatory Visit

## 2015-09-30 DIAGNOSIS — Z1231 Encounter for screening mammogram for malignant neoplasm of breast: Secondary | ICD-10-CM

## 2015-10-27 ENCOUNTER — Other Ambulatory Visit: Payer: Self-pay | Admitting: *Deleted

## 2015-10-27 DIAGNOSIS — C50912 Malignant neoplasm of unspecified site of left female breast: Secondary | ICD-10-CM

## 2015-10-27 MED ORDER — LETROZOLE 2.5 MG PO TABS: ORAL_TABLET | ORAL | Status: DC

## 2016-04-18 ENCOUNTER — Other Ambulatory Visit: Payer: Self-pay | Admitting: Obstetrics & Gynecology

## 2016-04-18 NOTE — Telephone Encounter (Signed)
Medication refill request: Klonopin  Last AEX:  04-21-15 Next AEX: 08-30-16 Last MMG (if hormonal medication request): 10-01-15 WNL Refill authorized: please advise

## 2016-04-18 NOTE — Telephone Encounter (Signed)
Prescription was faxed to [facility] on file. 04/18/16 -sco

## 2016-06-30 ENCOUNTER — Other Ambulatory Visit: Payer: Self-pay | Admitting: Family Medicine

## 2016-06-30 DIAGNOSIS — R740 Nonspecific elevation of levels of transaminase and lactic acid dehydrogenase [LDH]: Principal | ICD-10-CM

## 2016-06-30 DIAGNOSIS — R7401 Elevation of levels of liver transaminase levels: Secondary | ICD-10-CM

## 2016-07-08 ENCOUNTER — Ambulatory Visit
Admission: RE | Admit: 2016-07-08 | Discharge: 2016-07-08 | Disposition: A | Discharge status: Home / Self Care | Payer: 59 | Source: Ambulatory Visit | Attending: Family Medicine | Admitting: Family Medicine

## 2016-07-08 DIAGNOSIS — R740 Nonspecific elevation of levels of transaminase and lactic acid dehydrogenase [LDH]: Principal | ICD-10-CM

## 2016-07-08 DIAGNOSIS — R7401 Elevation of levels of liver transaminase levels: Secondary | ICD-10-CM

## 2016-08-03 NOTE — Assessment & Plan Note (Signed)
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 hot flashes previously but not severe.  2. Her bone density test done in 2014 revealed mild osteopenia with a T score of -1.2. I recommended that Marilyn Hansen take vitamin D. Repeat bone density test November 2016 showed a T score of -1.3 (osteopenia).  [place] abd: fatty liver Her serum calcium levels on the higher end of normal range. Plan is to continue Letrozole for 10 years.  Breast Cancer Surveillance: 1. Breast exam 08/04/16: Normal 2. Mammogram 09/30/15 No abnormalities. Postsurgical changes. Breast Density Category B. I recommended that Marilyn Hansen get 3-D mammograms for surveillance. Discussed the differences between different breast density categories.  I encouraged her to eat more fruits and vegetables and exercise regularly.  RTC in 1 year

## 2016-08-04 ENCOUNTER — Ambulatory Visit (HOSPITAL_BASED_OUTPATIENT_CLINIC_OR_DEPARTMENT_OTHER): Payer: 59 | Admitting: Hematology and Oncology

## 2016-08-04 ENCOUNTER — Encounter: Payer: Self-pay | Admitting: Hematology and Oncology

## 2016-08-04 DIAGNOSIS — Z170 Estrogen receptor positive status [ER+]: Secondary | ICD-10-CM

## 2016-08-04 DIAGNOSIS — C50412 Malignant neoplasm of upper-outer quadrant of left female breast: Secondary | ICD-10-CM

## 2016-08-04 NOTE — Progress Notes (Signed)
Patient Care Team: Kathyrn Lass, MD as PCP - General (Family Medicine)  DIAGNOSIS:  Encounter Diagnosis  Name Primary?  . Malignant neoplasm of upper-outer quadrant of left breast in female, estrogen receptor positive ([place])     SUMMARY OF ONCOLOGIC HISTORY:   Breast cancer of upper-outer quadrant of left female breast ([place])   09/29/2006 Surgery    Left breast lumpectomy: Invasive ductal carcinoma 2.6 cm grade 21 SLN positive for micrometastatic disease 9 additional lymph nodes were negative, ER 99%, PR 90%, HER-2 1+, Ki-67 12% Oncotype DX intermediate risk       Procedure    BRCA1 and 2 testing was done and it was negative      11/30/2006 - 04/01/2007 Chemotherapy    Taxotere Cytoxan x4       Radiation Therapy    Adjuvant radiation therapy      09/01/2007 -  Anti-estrogen oral therapy    Tamoxifen from 2009 to 2011 then switched to Letrozole 2.5 mg daily plan is to take this indefinitely,        CHIEF COMPLIANT: Follow-up on letrozole  INTERVAL HISTORY: Marilyn Hansen is a 60 year old with above-mentioned history of left breast cancer who underwent lumpectomy followed by adjuvant chemotherapy and radiation and is currently on adjuvant antiestrogen therapy. She takes letrozole. She is tolerating it extremely well. Her bone density done in 2016 showed a T score of -1.3. She denies any myalgias or arthralgias. Denies any hot flashes. She plans to take antiestrogen therapy for total of 10 years  REVIEW OF SYSTEMS:   Constitutional: Denies fevers, chills or abnormal weight loss Eyes: Denies blurriness of vision Ears, nose, mouth, throat, and face: Denies mucositis or sore throat Respiratory: Denies cough, dyspnea or wheezes Cardiovascular: Denies palpitation, chest discomfort Gastrointestinal:  Denies nausea, heartburn or change in bowel habits Skin: Denies abnormal skin rashes Lymphatics: Denies new lymphadenopathy or easy bruising Neurological:Denies numbness, tingling or new  weaknesses Behavioral/Psych: Mood is stable, no new changes  Extremities: No lower extremity edema Breast:  denies any pain or lumps or nodules in either breasts All other systems were reviewed with the patient and are negative.  I have reviewed the past medical history, past surgical history, social history and family history with the patient and they are unchanged from previous note.  ALLERGIES:  is allergic to dayquil [[id number]].  MEDICATIONS:  Current Outpatient Prescriptions  Medication Sig Dispense Refill  . Ascorbic Acid (VITAMIN C PO) Take by mouth daily.    Marland Kitchen aspirin 81 MG tablet Take by mouth daily.     . clonazePAM (KLONOPIN) 0.5 MG tablet TAKE 1 TABLET BY MOUTH EVERY DAY AS NEEDED 30 tablet 0  . escitalopram (LEXAPRO) 10 MG tablet 10 mg daily.    . Flaxseed, Linseed, (FLAX SEED OIL PO) Take by mouth daily.    Marland Kitchen letrozole (FEMARA) 2.5 MG tablet Take 1 tablet by mouth  daily 90 tablet 3  . lovastatin (MEVACOR) 20 MG tablet Take 20 mg by mouth at bedtime.    . Multiple Vitamins-Minerals (MULTIVITAMIN PO) Take by mouth daily.    . Omega-3 Fatty Acids (FISH OIL PO) Take by mouth daily.    . traZODone (DESYREL) 50 MG tablet      No current facility-administered medications for this visit.     PHYSICAL EXAMINATION: ECOG PERFORMANCE STATUS: 0 - Asymptomatic  Vitals:   08/04/16 1014  BP: (!) 144/66  Pulse: 84  Resp: 18  Temp: 98.5 F (36.9 C)  Filed Weights   08/04/16 1014  Weight: 183 lb (83 kg)    GENERAL:alert, no distress and comfortable SKIN: skin color, texture, turgor are normal, no rashes or significant lesions EYES: normal, Conjunctiva are pink and non-injected, sclera clear OROPHARYNX:no exudate, no erythema and lips, buccal mucosa, and tongue normal  NECK: supple, thyroid normal size, non-tender, without nodularity LYMPH:  no palpable lymphadenopathy in the cervical, axillary or inguinal LUNGS: clear to auscultation and percussion with  normal breathing effort HEART: regular rate & rhythm and no murmurs and no lower extremity edema ABDOMEN:abdomen soft, non-tender and normal bowel sounds MUSCULOSKELETAL:no cyanosis of digits and no clubbing  NEURO: alert & oriented x 3 with fluent speech, no focal motor/sensory deficits EXTREMITIES: No lower extremity edema BREAST: No palpable masses or nodules in either right or left breasts. No palpable axillary supraclavicular or infraclavicular adenopathy no breast tenderness or nipple discharge. (exam performed in the presence of a chaperone)  LABORATORY DATA:  I have reviewed the data as listed   Chemistry      Component Value Date/Time   NA 142 07/24/2014 1122   K 3.9 07/24/2014 1122   CL 102 12/26/2012 1613   CO2 27 07/24/2014 1122   BUN 14.7 07/24/2014 1122   CREATININE 0.8 07/24/2014 1122      Component Value Date/Time   CALCIUM 10.3 07/24/2014 1122   ALKPHOS 113 07/24/2014 1122   AST 19 07/24/2014 1122   ALT 19 07/24/2014 1122   BILITOT 0.39 07/24/2014 1122       Lab Results  Component Value Date   WBC 6.8 07/24/2014   HGB 13.2 04/21/2015   HCT 40.6 07/24/2014   MCV 93.4 07/24/2014   PLT 270 07/24/2014   NEUTROABS 4.4 07/24/2014    ASSESSMENT & PLAN:  Breast cancer of upper-outer quadrant of left female breast ([place]) Left breast invasive ductal carcinoma T2, N1, M0 stage IIB ER/PR positive HER-2 negative BRCA1 and 2 negative Oncotype DX intermediate risk received 4 cycles of Taxotere and Cytoxan followed by radiation that was completed in 2009 and started on tamoxifen for 2 years and then letrozole 2.5 mg daily  Aromatase inhibitor toxicities: 1. hot flashes previously but not severe.  2. Her bone density test done in 2016 revealed mild osteopenia with a T score of -1.3. I recommended that she take vitamin D. We need to repeat a bone density next year.  [place] abd: fatty liver Her serum calcium levels on the higher end of normal range. Plan is to continue  Letrozole for 10 years.  Breast Cancer Surveillance: 1. Breast exam 08/04/16: Normal 2. Mammogram 09/30/15 No abnormalities. Postsurgical changes. Breast Density Category B. I recommended that she get 3-D mammograms for surveillance. Discussed the differences between different breast density categories.  I encouraged her to eat more fruits and vegetables and exercise regularly.  RTC in 1 year   No orders of the defined types were placed in this encounter.  The patient has a good understanding of the overall plan. she agrees with it. she will call with any problems that may develop before the next visit here.   Rulon Eisenmenger, MD 08/04/16

## 2016-08-19 ENCOUNTER — Other Ambulatory Visit: Payer: Self-pay | Admitting: Obstetrics & Gynecology

## 2016-08-23 NOTE — Telephone Encounter (Signed)
Medication refill request: Klonopin  Last AEX:  04-21-15  Next AEX: 08-30-16  Last MMG (if hormonal medication request): 10-01-15 WNL  Refill authorized: please advise

## 2016-08-30 ENCOUNTER — Ambulatory Visit: Payer: 59 | Admitting: Obstetrics & Gynecology

## 2016-09-02 ENCOUNTER — Other Ambulatory Visit: Payer: Self-pay | Admitting: Obstetrics & Gynecology

## 2016-09-02 DIAGNOSIS — Z1231 Encounter for screening mammogram for malignant neoplasm of breast: Secondary | ICD-10-CM

## 2016-09-05 ENCOUNTER — Encounter: Payer: Self-pay | Admitting: Obstetrics & Gynecology

## 2016-09-05 ENCOUNTER — Ambulatory Visit (INDEPENDENT_AMBULATORY_CARE_PROVIDER_SITE_OTHER): Payer: 59 | Admitting: Obstetrics & Gynecology

## 2016-09-05 VITALS — BP 126/84 | HR 78 | Resp 14 | Ht 65.0 in | Wt 184.0 lb

## 2016-09-05 DIAGNOSIS — K760 Fatty (change of) liver, not elsewhere classified: Secondary | ICD-10-CM | POA: Diagnosis not present

## 2016-09-05 DIAGNOSIS — G4700 Insomnia, unspecified: Secondary | ICD-10-CM

## 2016-09-05 DIAGNOSIS — F411 Generalized anxiety disorder: Secondary | ICD-10-CM | POA: Diagnosis not present

## 2016-09-05 DIAGNOSIS — Z0000 Encounter for general adult medical examination without abnormal findings: Secondary | ICD-10-CM | POA: Diagnosis not present

## 2016-09-05 DIAGNOSIS — I10 Essential (primary) hypertension: Secondary | ICD-10-CM

## 2016-09-05 DIAGNOSIS — Z01419 Encounter for gynecological examination (general) (routine) without abnormal findings: Secondary | ICD-10-CM

## 2016-09-05 DIAGNOSIS — Z124 Encounter for screening for malignant neoplasm of cervix: Secondary | ICD-10-CM

## 2016-09-05 LAB — POCT URINALYSIS DIPSTICK
BILIRUBIN UA: NEGATIVE
Glucose, UA: NEGATIVE
KETONES UA: NEGATIVE
Leukocytes, UA: NEGATIVE
Nitrite, UA: NEGATIVE
PH UA: 5
Protein, UA: NEGATIVE
RBC UA: NEGATIVE
Urobilinogen, UA: NEGATIVE

## 2016-09-05 NOTE — Progress Notes (Signed)
61 y.o. G0P0000 Married Caucasian F here for annual exam.  Doing well.  Denies vaginal bleeding.  Denies vaginal bleeding.  Diagnosed with fatty liver disease this year.  Had abd ultrasound that also showed some gall bladder sludge.  Liver enzymes have been mildly elevated.   PCP:  Dr. Viviann Spare.  [facility] [address].  Patient's last menstrual period was 11/21/2006.          Sexually active: Yes.    The current method of family planning is vasectomy.    Exercising: No.  The patient does not participate in regular exercise at present. Smoker:  no  Health Maintenance: Pap:  04/21/15 negative  History of abnormal Pap:  no MMG:  10/01/15 BIRADS 1 negative  Colonoscopy:  05/09- repeat 10 years  BMD:   07/10/15 osteopenia TDaP:  12/19/12 Pneumonia vaccine(s):  never Zostavax:   never Hep C testing: donates blood- last donated 09/17  Screening Labs: PCP, Hb today: PCP, Urine today: normal    reports that she has never smoked. She has never used smokeless tobacco. She reports that she does not drink alcohol or use drugs.  Past Medical History:  Diagnosis Date  . Breast cancer ([place]) 1/08   invasive breast cancer (T2, N1 ER+/PR+) BRCA 1/11 negative  . H/O left breast biopsy 2/07   intraductal papilloma with atypical ductal hyperplasia  . Hypertension     Past Surgical History:  Procedure Laterality Date  . BILATERAL SALPINGOOPHORECTOMY  6/10   l  . BREAST BIOPSY  2/07   breast excisional biopsy/chemo  . BREAST BIOPSY  1/14 or 2/14   right-benign  . BREAST ENHANCEMENT SURGERY  2003   saline  . WISDOM TOOTH EXTRACTION      Current Outpatient Prescriptions  Medication Sig Dispense Refill  . Ascorbic Acid (VITAMIN C PO) Take by mouth daily.    Marland Kitchen aspirin 81 MG tablet Take by mouth daily.     . clonazePAM (KLONOPIN) 0.5 MG tablet TAKE 1 TABLET BY MOUTH EVERY DAY AS NEEDED 30 tablet 2  . escitalopram (LEXAPRO) 10 MG tablet 10 mg daily.    . Flaxseed, Linseed, (FLAX SEED OIL PO) Take by  mouth daily.    . hydrochlorothiazide (HYDRODIURIL) 25 MG tablet     . hydrOXYzine (ATARAX/VISTARIL) 25 MG tablet Take 25 mg by mouth 3 (three) times daily as needed.    Marland Kitchen letrozole (FEMARA) 2.5 MG tablet Take 1 tablet by mouth  daily 90 tablet 3  . lovastatin (MEVACOR) 40 MG tablet     . Multiple Vitamins-Minerals (MULTIVITAMIN PO) Take by mouth daily.    . Omega-3 Fatty Acids (FISH OIL PO) Take by mouth daily.    . traZODone (DESYREL) 50 MG tablet      No current facility-administered medications for this visit.     Family History  Problem Relation Age of Onset  . Hypertension Maternal Grandmother   . Heart disease Maternal Grandmother   . Hypertension Paternal Grandmother   . Heart disease Paternal Grandmother   . Alzheimer's disease Father   . Anemia Mother   . Osteoporosis Mother   . Alcohol abuse Mother     ROS:  Pertinent items are noted in HPI.  Otherwise, a comprehensive ROS was negative.  Exam:   BP 126/84 (BP Location: Right Arm, Patient Position: Sitting, Cuff Size: Normal)   Pulse 78   Resp 14   Ht '5\' 5"'  (1.651 m)   Wt 184 lb (83.5 kg)  LMP 11/21/2006   BMI 30.62 kg/m   Weight change: +1#  Height: '5\' 5"'  (165.1 cm)  Ht Readings from Last 3 Encounters:  09/05/16 '5\' 5"'  (1.651 m)  08/04/16 '5\' 5"'  (1.651 m)  08/05/15 '5\' 5"'  (1.651 m)   General appearance: alert, cooperative and appears stated age Head: Normocephalic, without obvious abnormality, atraumatic Neck: no adenopathy, supple, symmetrical, trachea midline and thyroid normal to inspection and palpation Lungs: clear to auscultation bilaterally Breasts: normal appearance, no masses or tenderness Heart: regular rate and rhythm Abdomen: soft, non-tender; bowel sounds normal; no masses,  no organomegaly Extremities: extremities normal, atraumatic, no cyanosis or edema Skin: Skin color, texture, turgor normal. No rashes or lesions Lymph nodes: Cervical, supraclavicular, and axillary nodes normal. No abnormal  inguinal nodes palpated Neurologic: Grossly normal   Pelvic: External genitalia:  no lesions              Urethra:  normal appearing urethra with no masses, tenderness or lesions              Bartholins and Skenes: normal                 Vagina: normal appearing vagina with normal color and discharge, no lesions              Cervix: no lesions              Pap taken: Yes.   Bimanual Exam:  Uterus:  normal size, contour, position, consistency, mobility, non-tender              Adnexa: normal adnexa and no mass, fullness, tenderness               Rectovaginal: Confirms               Anus:  normal sphincter tone, no lesions  Chaperone was present for exam.  A:  Well Woman with normal exam  H/O left invasive ductal breast cancer 1/08, on Femara.  (lumpectomy, chemo, radiation.  Neg BRCA testing.  Intermediate oncotype). Anxiety  Hypertension  insomnia Fatty liver disease Elevated lipids   P:  Mammogram yearly.  Doing 3D MMG. Pap and HR HPV obtained today Labs with PCP Clonazepam 0.43m qd prn.  Refills done 08/25/16 for #30/2RF. Return annually or prn

## 2016-09-07 LAB — IPS PAP TEST WITH HPV

## 2016-09-30 ENCOUNTER — Ambulatory Visit
Admission: RE | Admit: 2016-09-30 | Discharge: 2016-09-30 | Disposition: A | Discharge status: Home / Self Care | Payer: 59 | Source: Ambulatory Visit | Attending: Obstetrics & Gynecology | Admitting: Obstetrics & Gynecology

## 2016-09-30 DIAGNOSIS — Z1231 Encounter for screening mammogram for malignant neoplasm of breast: Secondary | ICD-10-CM

## 2016-09-30 HISTORY — DX: Personal history of antineoplastic chemotherapy: Z92.21

## 2016-09-30 HISTORY — DX: Personal history of irradiation: Z92.3

## 2016-10-22 ENCOUNTER — Other Ambulatory Visit: Payer: Self-pay | Admitting: Hematology and Oncology

## 2016-10-22 DIAGNOSIS — C50912 Malignant neoplasm of unspecified site of left female breast: Secondary | ICD-10-CM

## 2017-08-01 ENCOUNTER — Telehealth: Payer: Self-pay | Admitting: Hematology and Oncology

## 2017-08-01 ENCOUNTER — Ambulatory Visit (HOSPITAL_BASED_OUTPATIENT_CLINIC_OR_DEPARTMENT_OTHER): Payer: 59 | Admitting: Hematology and Oncology

## 2017-08-01 VITALS — BP 148/75 | HR 80 | Temp 98.2°F | Resp 20 | Ht 65.0 in | Wt 184.2 lb

## 2017-08-01 DIAGNOSIS — M8580 Other specified disorders of bone density and structure, unspecified site: Secondary | ICD-10-CM

## 2017-08-01 DIAGNOSIS — Z170 Estrogen receptor positive status [ER+]: Secondary | ICD-10-CM

## 2017-08-01 DIAGNOSIS — K760 Fatty (change of) liver, not elsewhere classified: Secondary | ICD-10-CM | POA: Diagnosis not present

## 2017-08-01 DIAGNOSIS — Z780 Asymptomatic menopausal state: Secondary | ICD-10-CM

## 2017-08-01 DIAGNOSIS — C50912 Malignant neoplasm of unspecified site of left female breast: Secondary | ICD-10-CM

## 2017-08-01 DIAGNOSIS — C50412 Malignant neoplasm of upper-outer quadrant of left female breast: Secondary | ICD-10-CM | POA: Diagnosis not present

## 2017-08-01 MED ORDER — LETROZOLE 2.5 MG PO TABS: 2.5000 mg | ORAL_TABLET | Freq: Every day | ORAL | 3 refills | Status: DC

## 2017-08-01 NOTE — Telephone Encounter (Signed)
Gave avs and calendar for December 2019 °

## 2017-08-01 NOTE — Assessment & Plan Note (Addendum)
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 hot flashes previously but not severe.  2. Bone density test November 2016 showed a T score of -1.3 (osteopenia).  [place] abd: fatty liver Her serum calcium levels on the higher end of normal range. Plan is to continue Letrozole for 10 years.  Breast Cancer Surveillance: 1. Breast exam 08/01/2017: Normal 2. Mammogram 09/30/2016 no abnormalities. Postsurgical changes. Breast Density Category B.  I encouraged her to eat more fruits and vegetables and exercise regularly.  RTC in 1 year

## 2017-08-01 NOTE — Progress Notes (Signed)
Patient Care Team: Kathyrn Lass, MD as PCP - General (Family Medicine)  DIAGNOSIS:  Encounter Diagnoses  Name Primary?  . Malignant neoplasm of upper-outer quadrant of left breast in female, estrogen receptor positive ([place])   . Post-menopausal Yes  . Malignant neoplasm of left female breast ([place])     SUMMARY OF ONCOLOGIC HISTORY:   Breast cancer of upper-outer quadrant of left female breast ([place])   09/29/2006 Surgery    Left breast lumpectomy: Invasive ductal carcinoma 2.6 cm grade 21 SLN positive for micrometastatic disease 9 additional lymph nodes were negative, ER 99%, PR 90%, HER-2 1+, Ki-67 12% Oncotype DX intermediate risk       Procedure    BRCA1 and 2 testing was done and it was negative      11/30/2006 - 04/01/2007 Chemotherapy    Taxotere Cytoxan x4       Radiation Therapy    Adjuvant radiation therapy      09/01/2007 -  Anti-estrogen oral therapy    Tamoxifen from 2009 to 2011 then switched to Letrozole 2.5 mg daily plan is to take this indefinitely,        CHIEF COMPLIANT: Follow-up on letrozole therapy  INTERVAL HISTORY: Marilyn Hansen is a 61 year old with above-mentioned history of left breast cancer treated with lumpectomy followed by adjuvant chemotherapy and radiation.  She is currently on oral antiestrogen therapy with letrozole.  She is tolerating letrozole extremely well without any major problems or concerns.  Denies any major hot flashes or arthralgias or myalgias.  She denies any lumps or nodules in the breast.  REVIEW OF SYSTEMS:   Constitutional: Denies fevers, chills or abnormal weight loss Eyes: Denies blurriness of vision Ears, nose, mouth, throat, and face: Denies mucositis or sore throat Respiratory: Denies cough, dyspnea or wheezes Cardiovascular: Denies palpitation, chest discomfort Gastrointestinal:  Denies nausea, heartburn or change in bowel habits Skin: Denies abnormal skin rashes Lymphatics: Denies new lymphadenopathy or easy  bruising Neurological:Denies numbness, tingling or new weaknesses Behavioral/Psych: Mood is stable, no new changes  Extremities: No lower extremity edema Breast:  denies any pain or lumps or nodules in either breasts All other systems were reviewed with the patient and are negative.  I have reviewed the past medical history, past surgical history, social history and family history with the patient and they are unchanged from previous note.  ALLERGIES:  is allergic to dayquil [[id number]].  MEDICATIONS:  Current Outpatient Medications  Medication Sig Dispense Refill  . Ascorbic Acid (VITAMIN C PO) Take by mouth daily.    Marland Kitchen aspirin 81 MG tablet Take by mouth daily.     . clonazePAM (KLONOPIN) 0.5 MG tablet TAKE 1 TABLET BY MOUTH EVERY DAY AS NEEDED 30 tablet 2  . escitalopram (LEXAPRO) 10 MG tablet 10 mg daily.    . Flaxseed, Linseed, (FLAX SEED OIL PO) Take by mouth daily.    . hydrochlorothiazide (HYDRODIURIL) 25 MG tablet     . hydrOXYzine (ATARAX/VISTARIL) 25 MG tablet Take 25 mg by mouth 3 (three) times daily as needed.    Marland Kitchen letrozole (FEMARA) 2.5 MG tablet Take 1 tablet (2.5 mg total) by mouth daily. 90 tablet 3  . lovastatin (MEVACOR) 40 MG tablet     . Multiple Vitamins-Minerals (MULTIVITAMIN PO) Take by mouth daily.    . Omega-3 Fatty Acids (FISH OIL PO) Take by mouth daily.    . traZODone (DESYREL) 50 MG tablet      No current facility-administered medications for this visit.  PHYSICAL EXAMINATION: ECOG PERFORMANCE STATUS: 1 - Symptomatic but completely ambulatory  Vitals:   08/01/17 1456  BP: (!) 148/75  Pulse: 80  Resp: 20  Temp: 98.2 F (36.8 C)  SpO2: 96%   Filed Weights   08/01/17 1456  Weight: 184 lb 3.2 oz (83.6 kg)    GENERAL:alert, no distress and comfortable SKIN: skin color, texture, turgor are normal, no rashes or significant lesions EYES: normal, Conjunctiva are pink and non-injected, sclera clear OROPHARYNX:no exudate, no  erythema and lips, buccal mucosa, and tongue normal  NECK: supple, thyroid normal size, non-tender, without nodularity LYMPH:  no palpable lymphadenopathy in the cervical, axillary or inguinal LUNGS: clear to auscultation and percussion with normal breathing effort HEART: regular rate & rhythm and no murmurs and no lower extremity edema ABDOMEN:abdomen soft, non-tender and normal bowel sounds MUSCULOSKELETAL:no cyanosis of digits and no clubbing  NEURO: alert & oriented x 3 with fluent speech, no focal motor/sensory deficits EXTREMITIES: No lower extremity edema  LABORATORY DATA:  I have reviewed the data as listed   Chemistry      Component Value Date/Time   NA 142 07/24/2014 1122   K 3.9 07/24/2014 1122   CL 102 12/26/2012 1613   CO2 27 07/24/2014 1122   BUN 14.7 07/24/2014 1122   CREATININE 0.8 07/24/2014 1122      Component Value Date/Time   CALCIUM 10.3 07/24/2014 1122   ALKPHOS 113 07/24/2014 1122   AST 19 07/24/2014 1122   ALT 19 07/24/2014 1122   BILITOT 0.39 07/24/2014 1122       Lab Results  Component Value Date   WBC 6.8 07/24/2014   HGB 13.2 04/21/2015   HCT 40.6 07/24/2014   MCV 93.4 07/24/2014   PLT 270 07/24/2014   NEUTROABS 4.4 07/24/2014    ASSESSMENT & PLAN:  Breast cancer of upper-outer quadrant of left female breast ([place]) Left breast invasive ductal carcinoma T2, N1, M0 stage IIB ER/PR positive HER-2 negative BRCA1 and 2 negative Oncotype DX intermediate risk received 4 cycles of Taxotere and Cytoxan followed by radiation that was completed in 2009 and started on tamoxifen for 2 years and then letrozole 2.5 mg daily  Aromatase inhibitor toxicities: 1. hot flashes previously but not severe.  2. Bone density test November 2016 showed a T score of -1.3 (osteopenia). I ordered a bone density test to be repeated within the next few weeks I discussed with her that there is no data for continuation of antiestrogen therapy beyond 10 years.  However  patient wishes to continue to take it in spite of this by her own preference.  [place] abd: fatty liver Her serum calcium levels on the higher end of normal range.   Breast Cancer Surveillance: 1. Breast exam 08/01/2017: Normal 2. Mammogram 09/30/2016 no abnormalities. Postsurgical changes. Breast Density Category B.  I encouraged her to eat more fruits and vegetables and exercise regularly.  RTC in 1 year   I spent 25 minutes talking to the patient of which more than half was spent in counseling and coordination of care.  Orders Placed This Encounter  Procedures  . DG Bone Density    Standing Status:   Future    Standing Expiration Date:   09/05/2018    Order Specific Question:   Reason for exam:    Answer:   Post menopausal on hormonal therapy    Order Specific Question:   Preferred imaging location?    Answer:   [facility]   The patient  has a good understanding of the overall plan. she agrees with it. she will call with any problems that may develop before the next visit here.   Rulon Eisenmenger, MD 08/01/17

## 2017-08-03 ENCOUNTER — Ambulatory Visit
Admission: RE | Admit: 2017-08-03 | Discharge: 2017-08-03 | Disposition: A | Discharge status: Home / Self Care | Payer: 59 | Source: Ambulatory Visit | Attending: Hematology and Oncology | Admitting: Hematology and Oncology

## 2017-08-03 DIAGNOSIS — Z780 Asymptomatic menopausal state: Secondary | ICD-10-CM

## 2017-08-28 ENCOUNTER — Other Ambulatory Visit: Payer: Self-pay | Admitting: Hematology and Oncology

## 2017-08-28 DIAGNOSIS — Z1231 Encounter for screening mammogram for malignant neoplasm of breast: Secondary | ICD-10-CM

## 2017-10-02 ENCOUNTER — Ambulatory Visit
Admission: RE | Admit: 2017-10-02 | Discharge: 2017-10-02 | Disposition: A | Discharge status: Home / Self Care | Payer: 59 | Source: Ambulatory Visit | Attending: Hematology and Oncology | Admitting: Hematology and Oncology

## 2017-10-02 DIAGNOSIS — Z1231 Encounter for screening mammogram for malignant neoplasm of breast: Secondary | ICD-10-CM

## 2017-12-12 ENCOUNTER — Other Ambulatory Visit: Payer: Self-pay | Admitting: Obstetrics & Gynecology

## 2017-12-13 NOTE — Telephone Encounter (Signed)
Medication refill request: Klonopin  Last AEX:  04-21-15  Next AEX: 12-19-17  Last MMG (if hormonal medication request): 10-03-17 WNL  Refill authorized: please advise

## 2017-12-19 ENCOUNTER — Encounter: Payer: Self-pay | Admitting: Obstetrics & Gynecology

## 2017-12-19 ENCOUNTER — Other Ambulatory Visit: Payer: Self-pay

## 2017-12-19 ENCOUNTER — Ambulatory Visit: Payer: 59 | Admitting: Obstetrics & Gynecology

## 2017-12-19 VITALS — BP 112/76 | HR 72 | Resp 16 | Ht 65.0 in | Wt 180.4 lb

## 2017-12-19 DIAGNOSIS — Z01419 Encounter for gynecological examination (general) (routine) without abnormal findings: Secondary | ICD-10-CM | POA: Diagnosis not present

## 2017-12-19 DIAGNOSIS — Z205 Contact with and (suspected) exposure to viral hepatitis: Secondary | ICD-10-CM

## 2017-12-19 MED FILL — SHINGRIX 50 MCG SUS: 50 | 1 days supply | Qty: 1 | Fill #0

## 2017-12-19 NOTE — Progress Notes (Signed)
62 y.o. G0P0000 MarriedCaucasianF here for annual exam.    Patient's last menstrual period was 11/21/2006.          Sexually active: No.  The current method of family planning is abstinence.    Exercising: No.   Smoker:  no  Health Maintenance: Pap:  09/05/16 Neg. HR HPV:neg   04/21/15 Neg  History of abnormal Pap:  no MMG:  10/03/17 BIRADS1:Neg  Colonoscopy:  12/2007 f/u 10 years.  Has not gotten reminder yet. BMD:   08/03/17 Osteopenia  TDaP:  2014 Pneumonia vaccine(s):  No Shingrix: No Hep C testing: Unsure Screening Labs: PCP.  Had appt in the winter.  Did blood work at that time.   reports that she has never smoked. She has never used smokeless tobacco. She reports that she does not drink alcohol or use drugs.  Past Medical History:  Diagnosis Date  . Breast cancer ([place]) 1/08   invasive breast cancer (T2, N1 ER+/PR+) BRCA 1/11 negative  . H/O left breast biopsy 2/07   intraductal papilloma with atypical ductal hyperplasia  . Hypertension   . Personal history of chemotherapy 2008  . Personal history of radiation therapy 2008    Past Surgical History:  Procedure Laterality Date  . AUGMENTATION MAMMAPLASTY Bilateral 2003  . BILATERAL SALPINGOOPHORECTOMY  6/10   l  . BREAST BIOPSY  2/07   breast excisional biopsy/chemo  . BREAST BIOPSY  1/14 or 2/14   right-benign  . BREAST ENHANCEMENT SURGERY  2003   saline  . REDUCTION MAMMAPLASTY Bilateral 2009  . WISDOM TOOTH EXTRACTION      Current Outpatient Medications  Medication Sig Dispense Refill  . Ascorbic Acid (VITAMIN C PO) Take by mouth daily.    . clonazePAM (KLONOPIN) 0.5 MG tablet TAKE 1 TABLET BY MOUTH DAILY AS NEEDED 30 tablet 0  . escitalopram (LEXAPRO) 10 MG tablet 10 mg daily.    . Flaxseed, Linseed, (FLAX SEED OIL PO) Take by mouth daily.    . hydrochlorothiazide (HYDRODIURIL) 25 MG tablet Take 25 mg by mouth daily.     . hydrOXYzine (ATARAX/VISTARIL) 25 MG tablet Take 25 mg by mouth 3 (three) times daily  as needed.    Marland Kitchen letrozole (FEMARA) 2.5 MG tablet Take 1 tablet (2.5 mg total) by mouth daily. 90 tablet 3  . lovastatin (MEVACOR) 40 MG tablet Take 40 mg by mouth at bedtime.     . Multiple Vitamins-Minerals (MULTIVITAMIN PO) Take by mouth daily.    . Omega-3 Fatty Acids (FISH OIL PO) Take by mouth daily.    . traZODone (DESYREL) 50 MG tablet Take 50 mg by mouth at bedtime as needed.      No current facility-administered medications for this visit.     Family History  Problem Relation Age of Onset  . Hypertension Maternal Grandmother   . Heart disease Maternal Grandmother   . Hypertension Paternal Grandmother   . Heart disease Paternal Grandmother   . Alzheimer's disease Father   . Anemia Mother   . Osteoporosis Mother   . Alcohol abuse Mother     Review of Systems  Gastrointestinal: Positive for diarrhea.       Change in quality of stools   Neurological: Positive for headaches.  All other systems reviewed and are negative.   Exam:   BP 112/76 (BP Location: Right Arm, Patient Position: Sitting, Cuff Size: Normal)   Pulse 72   Resp 16   Ht _0  (1.651 m)   Wt 180  lb 6.4 oz (81.8 kg)   LMP 11/21/2006   BMI 30.02 kg/m    Height: _0  (165.1 cm)  Ht Readings from Last 3 Encounters:  12/19/17 _1  (1.651 m)  08/01/17 _2  (1.651 m)  09/05/16 _3  (1.651 m)    General appearance: alert, cooperative and appears stated age Head: Normocephalic, without obvious abnormality, atraumatic Neck: no adenopathy, supple, symmetrical, trachea midline and thyroid normal to inspection and palpation Lungs: clear to auscultation bilaterally Breasts: left breast lumpectomy and LND scars, well healed and stable, right breast without masses, skin changes, nipple discharge or LAD Heart: regular rate and rhythm Abdomen: soft, non-tender; bowel sounds normal; no masses,  no organomegaly Extremities: extremities normal, atraumatic, no cyanosis or edema Skin: Skin color, texture, turgor  normal. No rashes or lesions Lymph nodes: Cervical, supraclavicular, and axillary nodes normal. No abnormal inguinal nodes palpated Neurologic: Grossly normal   Pelvic: External genitalia:  no lesions              Urethra:  normal appearing urethra with no masses, tenderness or lesions              Bartholins and Skenes: normal                 Vagina: normal appearing vagina with normal color and discharge, no lesions              Cervix: no lesions              Pap taken: No. Bimanual Exam:  Uterus:  normal size, contour, position, consistency, mobility, non-tender              Adnexa: normal adnexa and no mass, fullness, tenderness               Rectovaginal: Confirms               Anus:  normal sphincter tone, no lesions  Chaperone was present for exam.  A:  Well Woman with normal exam PMP, no HRT H/O left invasive ductal breast cancer 1/08, on Femara.  S/p lumpctomy, chemo radiation Hypertension Elevated lipids H/O fatty liver disease H/o anxiety and insomnia   P:   Mammogram guidelines reviewed.  Doing yearly. pap smear with HR HPV 2018.  Not indicated this year. Will obtained Hep C antibody today Information about Shingrix vaccine given Uses Clonazepam infrequently.  RF done for #30/0RF on 12/13/17. return annually or prn

## 2017-12-19 NOTE — Patient Instructions (Addendum)
[place] Lawrence Santiago [phone]

## 2017-12-20 LAB — HEPATITIS C ANTIBODY: Hep C Virus Ab: <0.1 s/co ratio (ref 0.0–0.9)

## 2018-03-15 MED FILL — SHINGRIX 50 MCG SUS: 50 | 1 days supply | Qty: 1 | Fill #1

## 2018-07-09 ENCOUNTER — Telehealth: Payer: Self-pay | Admitting: Hematology and Oncology

## 2018-07-09 NOTE — Telephone Encounter (Signed)
VG PAL 12/11 moved f/u to 08/29/18. Spoke with patient. Date per patient request.

## 2018-08-01 ENCOUNTER — Ambulatory Visit: Payer: 59 | Admitting: Hematology and Oncology

## 2018-08-28 ENCOUNTER — Other Ambulatory Visit: Payer: Self-pay | Admitting: Hematology and Oncology

## 2018-08-28 DIAGNOSIS — Z1231 Encounter for screening mammogram for malignant neoplasm of breast: Secondary | ICD-10-CM

## 2018-08-28 NOTE — Progress Notes (Signed)
 Patient Care Team: Miller, Lisa, MD as PCP - General (Family Medicine)  DIAGNOSIS:    ICD-10-CM   1. Malignant neoplasm of upper-outer quadrant of left breast in female, estrogen receptor positive (HCC) C50.412    Z17.0     SUMMARY OF ONCOLOGIC HISTORY:   Breast cancer of upper-outer quadrant of left female breast (HCC)   09/29/2006 Surgery    Left breast lumpectomy: Invasive ductal carcinoma 2.6 cm grade 21 SLN positive for micrometastatic disease 9 additional lymph nodes were negative, ER 99%, PR 90%, HER-2 1+, Ki-67 12% Oncotype DX intermediate risk     Procedure    BRCA1 and 2 testing was done and it was negative    11/30/2006 - 04/01/2007 Chemotherapy    Taxotere Cytoxan x4     Radiation Therapy    Adjuvant radiation therapy    09/01/2007 - 07/13/2018 Anti-estrogen oral therapy    Tamoxifen from 2009 to 2011 then switched to Letrozole 2.5 mg daily plan is to take this indefinitely,      CHIEF COMPLIANT: Follow-up of letrozole therapy  INTERVAL HISTORY: Marilyn Hansen is a 63 y.o. with above-mentioned history of left breast cancer treated with lumpectomy followed by adjuvant chemotherapy and radiation. She is currently on oral antiestrogen therapy with letrozole. I last saw the patient one year ago. Her most recent mammogram from 10/02/17 showed no evidence of malignancy. She presents to the clinic alone today and has been doing well. She reports she lost her letrozole bottle in mid-November so she hasn't taken it since. She reports she will come off letrozole because she has been feeling well and has now completed 10 years. She reviewed her medication list with me.   REVIEW OF SYSTEMS:   Constitutional: Denies fevers, chills or abnormal weight loss Eyes: Denies blurriness of vision Ears, nose, mouth, throat, and face: Denies mucositis or sore throat Respiratory: Denies cough, dyspnea or wheezes Cardiovascular: Denies palpitation, chest discomfort Gastrointestinal:  Denies  nausea, heartburn or change in bowel habits Skin: Denies abnormal skin rashes Lymphatics: Denies new lymphadenopathy or easy bruising Neurological:Denies numbness, tingling or new weaknesses Behavioral/Psych: Mood is stable, no new changes  Extremities: No lower extremity edema Breast: denies any pain or lumps or nodules in either breasts All other systems were reviewed with the patient and are negative.  I have reviewed the past medical history, past surgical history, social history and family history with the patient and they are unchanged from previous note.  ALLERGIES:  is allergic to dayquil [pseudoephedrine-apap-dm].  MEDICATIONS:  Current Outpatient Medications  Medication Sig Dispense Refill  . Ascorbic Acid (VITAMIN C PO) Take by mouth daily.    . clonazePAM (KLONOPIN) 0.5 MG tablet TAKE 1 TABLET BY MOUTH DAILY AS NEEDED 30 tablet 0  . escitalopram (LEXAPRO) 10 MG tablet 10 mg daily.    . Flaxseed, Linseed, (FLAX SEED OIL PO) Take by mouth daily.    . hydrochlorothiazide (HYDRODIURIL) 25 MG tablet Take 25 mg by mouth daily.     . hydrOXYzine (ATARAX/VISTARIL) 25 MG tablet Take 25 mg by mouth 3 (three) times daily as needed.    . lovastatin (MEVACOR) 40 MG tablet Take 40 mg by mouth at bedtime.     . Multiple Vitamins-Minerals (MULTIVITAMIN PO) Take by mouth daily.    . Omega-3 Fatty Acids (FISH OIL PO) Take by mouth daily.    . traZODone (DESYREL) 50 MG tablet Take 50 mg by mouth at bedtime as needed.        No current facility-administered medications for this visit.     PHYSICAL EXAMINATION: ECOG PERFORMANCE STATUS: 0 - Asymptomatic  Vitals:   08/29/18 1350  BP: 140/74  Pulse: 84  Resp: 20  Temp: 98.8 F (37.1 C)  SpO2: 98%   Filed Weights   08/29/18 1350  Weight: 190 lb 1.6 oz (86.2 kg)    GENERAL:alert, no distress and comfortable SKIN: skin color, texture, turgor are normal, no rashes or significant lesions EYES: normal, Conjunctiva are pink and  non-injected, sclera clear OROPHARYNX:no exudate, no erythema and lips, buccal mucosa, and tongue normal  NECK: supple, thyroid normal size, non-tender, without nodularity LYMPH:  no palpable lymphadenopathy in the cervical, axillary or inguinal LUNGS: clear to auscultation and percussion with normal breathing effort HEART: regular rate & rhythm and no murmurs and no lower extremity edema ABDOMEN:abdomen soft, non-tender and normal bowel sounds MUSCULOSKELETAL:no cyanosis of digits and no clubbing  NEURO: alert & oriented x 3 with fluent speech, no focal motor/sensory deficits EXTREMITIES: No lower extremity edema BREAST: No palpable masses or nodules in either right or left breasts. No palpable axillary supraclavicular or infraclavicular adenopathy no breast tenderness or nipple discharge. (exam performed in the presence of a chaperone)  LABORATORY DATA:  I have reviewed the data as listed CMP Latest Ref Rng & Units 07/24/2014 01/16/2014 07/03/2013  Glucose 70 - 140 mg/dl 99 101 101  BUN 7.0 - 26.0 mg/dL 14.7 19.3 13.7  Creatinine 0.6 - 1.1 mg/dL 0.8 0.8 0.8  Sodium 136 - 145 mEq/L 142 142 140  Potassium 3.5 - 5.1 mEq/L 3.9 3.9 3.5  Chloride 98 - 107 mEq/L - - -  CO2 22 - 29 mEq/L _0 Calcium 8.4 - 10.4 mg/dL 10.3 9.9 10.6(H)  Total Protein 6.4 - 8.3 g/dL 7.1 7.1 7.7  Total Bilirubin 0.20 - 1.20 mg/dL 0.39 0.24 0.50  Alkaline Phos 40 - 150 U/L 113 97 111  AST 5 - 34 U/L 19 18 59(H)  ALT 0 - 55 U/L 19 19 75(H)    Lab Results  Component Value Date   WBC 6.8 07/24/2014   HGB 13.2 04/21/2015   HCT 40.6 07/24/2014   MCV 93.4 07/24/2014   PLT 270 07/24/2014   NEUTROABS 4.4 07/24/2014    ASSESSMENT & PLAN:  Breast cancer of upper-outer quadrant of left female breast ([place]) Left breast invasive ductal carcinoma T2, N1, M0 stage IIB ER/PR positive HER-2 negative BRCA1 and 2 negative Oncotype DX intermediate risk received 4 cycles of Taxotere and Cytoxan followed by radiation that  was completed in 2009 and started on tamoxifen for 2 years and then letrozole 2.5 mg daily Completed 10 years of letrozole therapy by November 2019  Breast cancer surveillance: 1.  Breast exam 08/29/2018: Benign left breast scar tissue was palpated 2.  Mammogram is been scheduled for February 2020.  Return to clinic annually for long-term survivorship follow-up with Mendel Ryder.  No orders of the defined types were placed in this encounter.  The patient has a good understanding of the overall plan. she agrees with it. she will call with any problems that may develop before the next visit here.  Nicholas Lose, MD 08/29/2018   I, Cloyde Reams Dorshimer, am acting as scribe for Nicholas Lose, MD.  I have reviewed the above documentation for accuracy and completeness, and I agree with the above.

## 2018-08-29 ENCOUNTER — Telehealth: Payer: Self-pay | Admitting: Hematology and Oncology

## 2018-08-29 ENCOUNTER — Inpatient Hospital Stay: Payer: 59 | Attending: Hematology and Oncology | Admitting: Hematology and Oncology

## 2018-08-29 DIAGNOSIS — Z170 Estrogen receptor positive status [ER+]: Secondary | ICD-10-CM

## 2018-08-29 DIAGNOSIS — Z853 Personal history of malignant neoplasm of breast: Secondary | ICD-10-CM

## 2018-08-29 DIAGNOSIS — C50412 Malignant neoplasm of upper-outer quadrant of left female breast: Secondary | ICD-10-CM

## 2018-08-29 NOTE — Telephone Encounter (Signed)
Gave avs and calendar ° °

## 2018-08-29 NOTE — Assessment & Plan Note (Signed)
Left breast invasive ductal carcinoma T2, N1, M0 stage IIB ER/PR positive HER-2 negative BRCA1 and 2 negative Oncotype DX intermediate risk received 4 cycles of Taxotere and Cytoxan followed by radiation that was completed in 2009 and started on tamoxifen for 2 years and then letrozole 2.5 mg daily Completed 10 years of letrozole therapy by November 2019  Breast cancer surveillance: 1.  Breast exam 08/29/2018: Benign left breast scar tissue was palpated 2.  Mammogram is been scheduled for February 2020.  Return to clinic annually for long-term survivorship follow-up with Marilyn Hansen.

## 2018-10-04 ENCOUNTER — Ambulatory Visit
Admission: RE | Admit: 2018-10-04 | Discharge: 2018-10-04 | Disposition: A | Discharge status: Home / Self Care | Payer: Managed Care, Other (non HMO) | Source: Ambulatory Visit | Attending: Hematology and Oncology | Admitting: Hematology and Oncology

## 2018-10-04 DIAGNOSIS — Z1231 Encounter for screening mammogram for malignant neoplasm of breast: Secondary | ICD-10-CM

## 2019-03-06 ENCOUNTER — Other Ambulatory Visit: Payer: Self-pay

## 2019-03-08 ENCOUNTER — Ambulatory Visit (INDEPENDENT_AMBULATORY_CARE_PROVIDER_SITE_OTHER): Payer: Managed Care, Other (non HMO) | Admitting: Obstetrics & Gynecology

## 2019-03-08 ENCOUNTER — Other Ambulatory Visit: Payer: Self-pay

## 2019-03-08 ENCOUNTER — Encounter: Payer: Self-pay | Admitting: Obstetrics & Gynecology

## 2019-03-08 ENCOUNTER — Other Ambulatory Visit (HOSPITAL_COMMUNITY)
Admission: RE | Admit: 2019-03-08 | Discharge: 2019-03-08 | Disposition: A | Discharge status: Home / Self Care | Payer: Managed Care, Other (non HMO) | Source: Ambulatory Visit | Attending: Obstetrics & Gynecology | Admitting: Obstetrics & Gynecology

## 2019-03-08 VITALS — BP 130/88 | HR 84 | Temp 97.5°F | Ht 65.0 in | Wt 192.8 lb

## 2019-03-08 DIAGNOSIS — Z01419 Encounter for gynecological examination (general) (routine) without abnormal findings: Secondary | ICD-10-CM

## 2019-03-08 DIAGNOSIS — Z124 Encounter for screening for malignant neoplasm of cervix: Secondary | ICD-10-CM | POA: Insufficient documentation

## 2019-03-08 MED ORDER — TRAZODONE HCL 50 MG PO TABS: 50.0000 mg | ORAL_TABLET | Freq: Every evening | ORAL | 1 refills | Status: DC | PRN

## 2019-03-08 NOTE — Progress Notes (Signed)
63 y.o. G0P0000 Married White or Caucasian female here for annual exam.  Doing well.  Saw Dr. Lindi Adie in January.  She will follow up with PA next year.  Denies vaginal bleeding.    PCP:  Dr. Kathyrn Lass.  Last appointment was a phone visit last month.  Lab work was scheduled.  Cholesterol was elevated.    Patient's last menstrual period was 11/21/2006.          Sexually active: No.  The current method of family planning is post menopausal status.    Exercising: No.   Smoker:  no  Health Maintenance: Pap:  09/05/16 Neg. HR HPV:neg   04/21/15 neg  History of abnormal Pap:  no MMG:  10/04/18 BIRADS1:Neg  Colonoscopy:  2019 polyps. [facility].  Not sure about when follow up was recommended.   BMD:   08/03/17 Osteopenia  TDaP:  2014 Pneumonia vaccine(s):  n/a Shingrix:   Completed  Hep C testing: 12/19/17 neg  Screening Labs: PCP   reports that she has never smoked. She has never used smokeless tobacco. She reports that she does not drink alcohol or use drugs.  Past Medical History:  Diagnosis Date  . Breast cancer ([place]) 1/08   invasive breast cancer (T2, N1 ER+/PR+) BRCA 1/11 negative  . H/O left breast biopsy 2/07   intraductal papilloma with atypical ductal hyperplasia  . Hypertension   . Personal history of chemotherapy 2008  . Personal history of radiation therapy 2008    Past Surgical History:  Procedure Laterality Date  . AUGMENTATION MAMMAPLASTY Bilateral 2003  . BILATERAL SALPINGOOPHORECTOMY  6/10   l  . BREAST BIOPSY  2/07   breast excisional biopsy/chemo  . BREAST BIOPSY  1/14 or 2/14   right-benign  . BREAST ENHANCEMENT SURGERY  2003   saline  . REDUCTION MAMMAPLASTY Bilateral 2009  . WISDOM TOOTH EXTRACTION      Current Outpatient Medications  Medication Sig Dispense Refill  . Ascorbic Acid (VITAMIN C PO) Take by mouth daily.    . clonazePAM (KLONOPIN) 0.5 MG tablet TAKE 1 TABLET BY MOUTH DAILY AS NEEDED 30 tablet 0  . escitalopram (LEXAPRO) 10 MG tablet 10  mg daily.    . Flaxseed, Linseed, (FLAX SEED OIL PO) Take by mouth daily.    . hydrochlorothiazide (HYDRODIURIL) 25 MG tablet Take 25 mg by mouth daily.     . hydrOXYzine (ATARAX/VISTARIL) 25 MG tablet Take 25 mg by mouth 3 (three) times daily as needed.    . lovastatin (MEVACOR) 40 MG tablet Take 40 mg by mouth at bedtime.     . Multiple Vitamins-Minerals (MULTIVITAMIN PO) Take by mouth daily.    . Omega-3 Fatty Acids (FISH OIL PO) Take by mouth daily.    . traZODone (DESYREL) 50 MG tablet Take 50 mg by mouth at bedtime as needed.     Marland Kitchen acamprosate (CAMPRAL) 333 MG tablet TK 1 T PO TID     No current facility-administered medications for this visit.     Family History  Problem Relation Age of Onset  . Hypertension Maternal Grandmother   . Heart disease Maternal Grandmother   . Hypertension Paternal Grandmother   . Heart disease Paternal Grandmother   . Alzheimer's disease Father   . Anemia Mother   . Osteoporosis Mother   . Alcohol abuse Mother     Review of Systems  All other systems reviewed and are negative.   Exam:   BP 130/88   Pulse 84  Temp (!) 97.5 F (36.4 C) (Temporal)   Ht '5\' 5"'  (1.651 m)   Wt 192 lb 12.8 oz (87.5 kg)   LMP 11/21/2006   BMI 32.08 kg/m   Height:   Height: '5\' 5"'  (165.1 cm)  Ht Readings from Last 3 Encounters:  03/08/19 '5\' 5"'  (1.651 m)  08/29/18 '5\' 5"'  (1.651 m)  12/19/17 '5\' 5"'  (1.651 m)    General appearance: alert, cooperative and appears stated age Head: Normocephalic, without obvious abnormality, atraumatic Neck: no adenopathy, supple, symmetrical, trachea midline and thyroid normal to inspection and palpation Lungs: clear to auscultation bilaterally Breasts: normal appearance, no masses or tenderness Heart: regular rate and rhythm Abdomen: soft, non-tender; bowel sounds normal; no masses,  no organomegaly Extremities: extremities normal, atraumatic, no cyanosis or edema Skin: Skin color, texture, turgor normal. No rashes or  lesions Lymph nodes: Cervical, supraclavicular, and axillary nodes normal. No abnormal inguinal nodes palpated Neurologic: Grossly normal   Pelvic: External genitalia:  no lesions              Urethra:  normal appearing urethra with no masses, tenderness or lesions              Bartholins and Skenes: normal                 Vagina: normal appearing vagina with normal color and discharge, no lesions              Cervix: no lesions              Pap taken: Yes.   Bimanual Exam:  Uterus:  normal size, contour, position, consistency, mobility, non-tender              Adnexa: normal adnexa and no mass, fullness, tenderness               Rectovaginal: Confirms               Anus:  normal sphincter tone, no lesions  Chaperone was present for exam.  A:  Well Woman with normal exam PMP, no HRT H/o left invasive ductal breast cancer 1/08.  S/p lumpectomy, chemo/radidation H/o anxiety Insomnia H/o fatty liver disease  P:   Mammogram guidelines reviewed.  Continues to do yearly MMG pap smear obtained today Lab done with Dr. Sabra Heck Release signed for colonoscopy from 2019 Rx for Trazodone 44m nightly prn insomnia.  #90/1RF Does not need Clonazepam rx.   Return annually or prn

## 2019-03-11 LAB — CYTOLOGY - PAP: Diagnosis: NEGATIVE

## 2019-08-12 ENCOUNTER — Telehealth: Payer: Self-pay | Admitting: Adult Health

## 2019-08-12 NOTE — Telephone Encounter (Signed)
I left a message regarding reschedule °

## 2019-08-21 ENCOUNTER — Other Ambulatory Visit: Payer: Self-pay | Admitting: Hematology and Oncology

## 2019-08-21 DIAGNOSIS — Z1231 Encounter for screening mammogram for malignant neoplasm of breast: Secondary | ICD-10-CM

## 2019-08-30 ENCOUNTER — Encounter: Payer: Managed Care, Other (non HMO) | Admitting: Adult Health

## 2019-09-26 ENCOUNTER — Inpatient Hospital Stay: Payer: Managed Care, Other (non HMO) | Attending: Adult Health | Admitting: Adult Health

## 2019-09-26 DIAGNOSIS — Z853 Personal history of malignant neoplasm of breast: Secondary | ICD-10-CM

## 2019-09-26 NOTE — Progress Notes (Signed)
SURVIVORSHIP VIRTUAL VISIT:  I connected with Marilyn Hansen on 09/26/19 at 11:00 AM EST by telephone and verified that I am speaking with the correct person using two identifiers.  I discussed the limitations, risks, security and privacy concerns of performing an evaluation and management service by telephone and the availability of in person appointments. I also discussed with the patient that there may be a patient responsible charge related to this service. The patient expressed understanding and agreed to proceed.   Patient location: At home Provider Location: [place] in office  No others participating in call.    REASON FOR VISIT:  Routine follow-up for history of breast cancer.   BRIEF ONCOLOGIC HISTORY:  Oncology History  Breast cancer of upper-outer quadrant of left female breast ([name])  09/29/2006 Surgery   Left breast lumpectomy: Invasive ductal carcinoma 2.6 cm grade 21 SLN positive for micrometastatic disease 9 additional lymph nodes were negative, ER 99%, PR 90%, HER-2 1+, Ki-67 12% Oncotype DX intermediate risk    Procedure   BRCA1 and 2 testing was done and it was negative   11/30/2006 - 04/01/2007 Chemotherapy   Taxotere Cytoxan x4    Radiation Therapy   Adjuvant radiation therapy   09/01/2007 - 07/13/2018 Anti-estrogen oral therapy   Tamoxifen from 2009 to 2011 then switched to Letrozole 2.5 mg daily plan is to take this indefinitely,       INTERVAL HISTORY:  Marilyn Hansen presents to the [facility] today for routine follow-up for her history of breast cancer.  Overall, she reports feeling quite well.  She has stopped volunteering and is staying at home during the pandemic.  She is exercising regularly and enjoys walking her dog Marilyn Hansen.  She is up to date with her cancer screenings.   REVIEW OF SYSTEMS:  Review of Systems  Constitutional: Negative for appetite change, chills, fatigue, fever and unexpected weight change.  HENT:   Negative for hearing loss,  lump/mass, sore throat and trouble swallowing.   Eyes: Negative for eye problems and icterus.  Respiratory: Negative for chest tightness, cough and shortness of breath.   Cardiovascular: Negative for chest pain, leg swelling and palpitations.  Gastrointestinal: Negative for abdominal distention, abdominal pain, constipation, diarrhea, nausea and vomiting.  Endocrine: Negative for hot flashes.  Musculoskeletal: Negative for arthralgias.  Skin: Negative for itching and rash.  Neurological: Negative for dizziness, extremity weakness, headaches and numbness.  Hematological: Negative for adenopathy. Does not bruise/bleed easily.  Psychiatric/Behavioral: Negative for depression. The patient is not nervous/anxious.   Breast: Denies any new nodularity, masses, tenderness, nipple changes, or nipple discharge.       PAST MEDICAL/SURGICAL HISTORY:  Past Medical History:  Diagnosis Date  . Breast cancer ([place]) 1/08   invasive breast cancer (T2, N1 ER+/PR+) BRCA 1/11 negative  . H/O left breast biopsy 2/07   intraductal papilloma with atypical ductal hyperplasia  . Hypertension   . Personal history of chemotherapy 2008  . Personal history of radiation therapy 2008   Past Surgical History:  Procedure Laterality Date  . AUGMENTATION MAMMAPLASTY Bilateral 2003  . BILATERAL SALPINGOOPHORECTOMY  6/10   l  . BREAST BIOPSY  2/07   breast excisional biopsy/chemo  . BREAST BIOPSY  1/14 or 2/14   right-benign  . BREAST ENHANCEMENT SURGERY  2003   saline  . REDUCTION MAMMAPLASTY Bilateral 2009  . WISDOM TOOTH EXTRACTION       ALLERGIES:  Allergies  Allergen Reactions  . Dayquil [Pseudoephedrine-Apap-Dm] Hives     CURRENT  MEDICATIONS:  Outpatient Encounter Medications as of 09/26/2019  Medication Sig Note  . acamprosate (CAMPRAL) 333 MG tablet TK 1 T PO TID   . Ascorbic Acid (VITAMIN C PO) Take by mouth daily.   . clonazePAM (KLONOPIN) 0.5 MG tablet TAKE 1 TABLET BY MOUTH DAILY AS NEEDED    . escitalopram (LEXAPRO) 10 MG tablet 10 mg daily.   . Flaxseed, Linseed, (FLAX SEED OIL PO) Take by mouth daily.   . hydrochlorothiazide (HYDRODIURIL) 25 MG tablet Take 25 mg by mouth daily.  09/05/2016: Received from: External Pharmacy  . hydrOXYzine (ATARAX/VISTARIL) 25 MG tablet Take 25 mg by mouth 3 (three) times daily as needed.   . lovastatin (MEVACOR) 40 MG tablet Take 40 mg by mouth at bedtime.  09/05/2016: Received from: External Pharmacy  . Multiple Vitamins-Minerals (MULTIVITAMIN PO) Take by mouth daily.   . Omega-3 Fatty Acids (FISH OIL PO) Take by mouth daily.   . traZODone (DESYREL) 50 MG tablet Take 1 tablet (50 mg total) by mouth at bedtime as needed.    No facility-administered encounter medications on file as of 09/26/2019.     ONCOLOGIC FAMILY HISTORY:  Family History  Problem Relation Age of Onset  . Hypertension Maternal Grandmother   . Heart disease Maternal Grandmother   . Hypertension Paternal Grandmother   . Heart disease Paternal Grandmother   . Alzheimer's disease Father   . Anemia Mother   . Osteoporosis Mother   . Alcohol abuse Mother     GENETIC COUNSELING/TESTING: negative  SOCIAL HISTORY:  Social History   Socioeconomic History  . Marital status: Married    Spouse name: Not on file  . Number of children: Not on file  . Years of education: Not on file  . Highest education level: Not on file  Occupational History  . Not on file  Tobacco Use  . Smoking status: Never Smoker  . Smokeless tobacco: Never Used  Substance and Sexual Activity  . Alcohol use: No  . Drug use: No  . Sexual activity: Not Currently    Partners: Male    Birth control/protection: Abstinence  Other Topics Concern  . Not on file  Social History Narrative  . Not on file   Social Determinants of Health   Financial Resource Strain:   . Difficulty of Paying Living Expenses: Not on file  Food Insecurity:   . Worried About Charity fundraiser in the Last Year: Not on  file  . Ran Out of Food in the Last Year: Not on file  Transportation Needs:   . Lack of Transportation (Medical): Not on file  . Lack of Transportation (Non-Medical): Not on file  Physical Activity:   . Days of Exercise per Week: Not on file  . Minutes of Exercise per Session: Not on file  Stress:   . Feeling of Stress : Not on file  Social Connections:   . Frequency of Communication with Friends and Family: Not on file  . Frequency of Social Gatherings with Friends and Family: Not on file  . Attends Religious Services: Not on file  . Active Member of Clubs or Organizations: Not on file  . Attends Archivist Meetings: Not on file  . Marital Status: Not on file  Intimate Partner Violence:   . Fear of Current or Ex-Partner: Not on file  . Emotionally Abused: Not on file  . Physically Abused: Not on file  . Sexually Abused: Not on file  OBJECTIVE:  Sounds well, in no apparent distress.  Mood and behavior are normal.  Speech is normal.  Breathing is non labored.  LABORATORY DATA:  None for this visit   DIAGNOSTIC IMAGING:  Most recent mammogram:  CLINICAL DATA:  Screening.  EXAM: DIGITAL SCREENING BILATERAL MAMMOGRAM WITH TOMO AND CAD  COMPARISON:  Previous exam(s).  ACR Breast Density Category b: There are scattered areas of fibroglandular density.  FINDINGS: There are no findings suspicious for malignancy. Images were processed with CAD.  IMPRESSION: No mammographic evidence of malignancy. A result letter of this screening mammogram will be mailed directly to the patient.  RECOMMENDATION: Screening mammogram in one year. (Code:SM-B-01Y)  BI-RADS CATEGORY  1: Negative.   Electronically Signed   By: Stan  Maynard M.D.   On: 10/05/2018 09:53     ASSESSMENT AND PLAN:  Ms.. Hansen is a pleasant 63 y.o. female with history of Stage IIA left breast invasive ductal carcinoma, ER+/PR+/HER2-, diagnosed in 09/2006, treated with lumpectomy,  adjuvant chemotherapy, adjuvant radiation therapy, and anti-estrogen therapy with Tamoxifen, followed by Letrozole completed in 06/2018.  She [facility] for surveillance and routine follow-up.   1. History of breast cancer:  Marilyn Hansen is currently clinically and radiographically without evidence of disease or recurrence of breast cancer. She will be due for mammogram in 09/2019.  She and I discussed her follow up and she would like to continue to f/u annually.  I encouraged her to call me with any questions or concerns before her next visit at the cancer center, and I would be happy to see her sooner, if needed.    2. Bone health:  She was given education on specific food and activities to promote bone health.  3. Cancer screening:  Due to Marilyn Hansen's history and her age, she should receive screening for skin cancers, colon cancer, and gynecologic cancers. She was encouraged to follow-up with her PCP for appropriate cancer screenings.   4. Health maintenance and wellness promotion: Marilyn Hansen was encouraged to consume 5-7 servings of fruits and vegetables per day. She was also encouraged to engage in moderate to vigorous exercise for 30 minutes per day most days of the week. She was instructed to limit her alcohol consumption and continue to abstain from tobacco use.     Follow up instructions:    -Return to cancer center in one year for LTS -Mammogram due in 09/2019   The patient was provided an opportunity to ask questions and all were answered. The patient agreed with the plan and demonstrated an understanding of the instructions.   The patient was advised to call back or seek an in-person evaluation if the symptoms worsen or if the condition fails to improve as anticipated.   I provided 14 minutes of non face-to-face telephone visit time during this encounter, and > 50% was spent counseling as documented under my assessment & plan.   Lindsey Cornetto Causey,  NP Survivorship Program [facility] [phone]   Note: PRIMARY CARE PROVIDER Miller, Lisa, MD [phone]  

## 2019-09-27 ENCOUNTER — Telehealth: Payer: Self-pay | Admitting: Adult Health

## 2019-09-27 NOTE — Telephone Encounter (Signed)
I talk with patient regarding schedule  

## 2019-10-09 ENCOUNTER — Other Ambulatory Visit: Payer: Self-pay

## 2019-10-09 ENCOUNTER — Ambulatory Visit
Admission: RE | Admit: 2019-10-09 | Discharge: 2019-10-09 | Disposition: A | Discharge status: Home / Self Care | Payer: Managed Care, Other (non HMO) | Source: Ambulatory Visit | Attending: Hematology and Oncology | Admitting: Hematology and Oncology

## 2019-10-09 DIAGNOSIS — Z1231 Encounter for screening mammogram for malignant neoplasm of breast: Secondary | ICD-10-CM

## 2019-12-28 ENCOUNTER — Other Ambulatory Visit: Payer: Self-pay | Admitting: Obstetrics & Gynecology

## 2019-12-30 NOTE — Telephone Encounter (Signed)
Medication refill request: Trazadone Last AEX:  03-08-2019 SM Next AEX: 05-15-20 Last MMG (if hormonal medication request): n/a Refill authorized: Today, please advise.   Medication pended for #90, 0RF. Please refill if appropriate.

## 2020-05-14 NOTE — Progress Notes (Signed)
64 y.o. G0P0000 Married White or Caucasian female here for annual exam.  Doing well.  Denies vaginal bleeding.     Best friend had Covid and was in the [facility] for two weeks.  Friend has just gotten out of rehab.  Friend has a long road ahead of head.  Patient's last menstrual period was 11/21/2006.          Sexually active: No.  The current method of family planning is post menopausal status.    Exercising: Yes.    gym, walking Smoker:  no  Health Maintenance: Pap:  09-05-16 neg HPV HR neg, 03-08-2019 neg History of abnormal Pap:  no MMG:  10-10-2019 category b density birads 1:neg Colonoscopy:  04-05-2018 f/u 3 years BMD:   08-03-17 osteopenia TDaP:  2014 Pneumonia vaccine(s):  no Shingrix:   2019 Hep C testing: 2019 neg Screening Labs: does with Sabra Heck   reports that she has never smoked. She has never used smokeless tobacco. She reports current alcohol use. She reports that she does not use drugs.  Past Medical History:  Diagnosis Date  . Breast cancer ([place]) 1/08   invasive breast cancer (T2, N1 ER+/PR+) BRCA 1/11 negative  . H/O left breast biopsy 2/07   intraductal papilloma with atypical ductal hyperplasia  . Hypertension   . Personal history of chemotherapy 2008  . Personal history of radiation therapy 2008    Past Surgical History:  Procedure Laterality Date  . AUGMENTATION MAMMAPLASTY Bilateral 2003  . BILATERAL SALPINGOOPHORECTOMY  6/10   l  . BREAST BIOPSY  2/07   breast excisional biopsy/chemo  . BREAST BIOPSY  1/14 or 2/14   right-benign  . BREAST ENHANCEMENT SURGERY  2003   saline  . REDUCTION MAMMAPLASTY Bilateral 2009  . WISDOM TOOTH EXTRACTION      Current Outpatient Medications  Medication Sig Dispense Refill  . acamprosate (CAMPRAL) 333 MG tablet TK 1 T PO TID    . clonazePAM (KLONOPIN) 0.5 MG tablet TAKE 1 TABLET BY MOUTH DAILY AS NEEDED 30 tablet 0  . escitalopram (LEXAPRO) 10 MG tablet 10 mg daily.    . hydrochlorothiazide (HYDRODIURIL) 25 MG  tablet Take 25 mg by mouth daily.     . hydrOXYzine (ATARAX/VISTARIL) 25 MG tablet Take 25 mg by mouth 3 (three) times daily as needed.    . lovastatin (MEVACOR) 40 MG tablet Take 40 mg by mouth at bedtime.     . Multiple Vitamins-Minerals (MULTIVITAMIN PO) Take by mouth daily.    . traZODone (DESYREL) 50 MG tablet TAKE 1 TABLET BY MOUTH AT BEDTIME AS NEEDED 90 tablet 0   No current facility-administered medications for this visit.    Family History  Problem Relation Age of Onset  . Hypertension Maternal Grandmother   . Heart disease Maternal Grandmother   . Hypertension Paternal Grandmother   . Heart disease Paternal Grandmother   . Alzheimer's disease Father   . Anemia Mother   . Osteoporosis Mother   . Alcohol abuse Mother     Review of Systems  Constitutional: Negative.   HENT: Negative.   Eyes: Negative.   Respiratory: Negative.   Cardiovascular: Negative.   Gastrointestinal: Negative.   Endocrine: Negative.   Genitourinary:       Urine leakage  Musculoskeletal: Negative.   Skin: Negative.   Allergic/Immunologic: Negative.   Neurological: Negative.   Hematological: Negative.   Psychiatric/Behavioral: Negative.     Exam:   BP 114/78   Pulse 68   Resp 16  Ht '5\' 5"'  (1.651 m)   Wt 192 lb (87.1 kg)   LMP 11/21/2006   BMI 31.95 kg/m   Height: '5\' 5"'  (165.1 cm)  General appearance: alert, cooperative and appears stated age Head: Normocephalic, without obvious abnormality, atraumatic Neck: no adenopathy, supple, symmetrical, trachea midline and thyroid normal to inspection and palpation Lungs: clear to auscultation bilaterally Breasts: normal appearance, no masses or tenderness Heart: regular rate and rhythm Abdomen: soft, non-tender; bowel sounds normal; no masses,  no organomegaly Extremities: extremities normal, atraumatic, no cyanosis or edema Skin: Skin color, texture, turgor normal. No rashes or lesions Lymph nodes: Cervical, supraclavicular, and axillary  nodes normal. No abnormal inguinal nodes palpated Neurologic: Grossly normal   Pelvic: External genitalia:  no lesions              Urethra:  normal appearing urethra with no masses, tenderness or lesions              Bartholins and Skenes: normal                 Vagina: normal appearing vagina with normal color and discharge, no lesions              Cervix: no lesions              Pap taken: Yes.   Bimanual Exam:  Uterus:  normal size, contour, position, consistency, mobility, non-tender              Adnexa: normal adnexa and no mass, fullness, tenderness               Rectovaginal: Confirms               Anus:  normal sphincter tone, no lesions  Chaperone, Terence Lux, CMA, was present for exam.  A:  Well Woman with normal exam PMP, no HRT H/o left invasive ductal breast cancer 1/08.  S/p lumpectomy, chemo/radiation H/o anxiety Insomnia H/o fatty liver disease  P:   Mammogram guidelines reviewed.  Doing yearly MMG. pap smear with HR HPV obtained today Will have blood work with Dr. Kathyrn Lass in January Colonoscopy is due next year Plan BMD with next MMG.  Order placed today RF for trazodone 54m nightly.  #90/4RF. Return annually or prn

## 2020-05-15 ENCOUNTER — Ambulatory Visit (INDEPENDENT_AMBULATORY_CARE_PROVIDER_SITE_OTHER): Payer: Managed Care, Other (non HMO) | Admitting: Obstetrics & Gynecology

## 2020-05-15 ENCOUNTER — Encounter: Payer: Self-pay | Admitting: Obstetrics & Gynecology

## 2020-05-15 ENCOUNTER — Other Ambulatory Visit (HOSPITAL_COMMUNITY)
Admission: RE | Admit: 2020-05-15 | Discharge: 2020-05-15 | Disposition: A | Discharge status: Home / Self Care | Payer: Managed Care, Other (non HMO) | Source: Ambulatory Visit | Attending: Obstetrics & Gynecology | Admitting: Obstetrics & Gynecology

## 2020-05-15 ENCOUNTER — Other Ambulatory Visit: Payer: Self-pay

## 2020-05-15 VITALS — BP 114/78 | HR 68 | Resp 16 | Ht 65.0 in | Wt 192.0 lb

## 2020-05-15 DIAGNOSIS — Z124 Encounter for screening for malignant neoplasm of cervix: Secondary | ICD-10-CM | POA: Diagnosis not present

## 2020-05-15 DIAGNOSIS — Z01419 Encounter for gynecological examination (general) (routine) without abnormal findings: Secondary | ICD-10-CM

## 2020-05-15 DIAGNOSIS — M8580 Other specified disorders of bone density and structure, unspecified site: Secondary | ICD-10-CM | POA: Diagnosis not present

## 2020-05-15 MED ORDER — TRAZODONE HCL 50 MG PO TABS: 50.0000 mg | ORAL_TABLET | Freq: Every evening | ORAL | 4 refills | Status: DC | PRN

## 2020-05-18 LAB — CYTOLOGY - PAP
Comment: NEGATIVE
Diagnosis: NEGATIVE
High risk HPV: NEGATIVE

## 2020-06-05 ENCOUNTER — Ambulatory Visit
Admission: RE | Admit: 2020-06-05 | Discharge: 2020-06-05 | Disposition: A | Discharge status: Home / Self Care | Payer: Managed Care, Other (non HMO) | Source: Ambulatory Visit | Attending: Obstetrics & Gynecology | Admitting: Obstetrics & Gynecology

## 2020-06-05 ENCOUNTER — Other Ambulatory Visit: Payer: Self-pay

## 2020-06-05 DIAGNOSIS — M8580 Other specified disorders of bone density and structure, unspecified site: Secondary | ICD-10-CM

## 2020-08-31 ENCOUNTER — Other Ambulatory Visit: Payer: Self-pay | Admitting: Hematology and Oncology

## 2020-08-31 DIAGNOSIS — Z1231 Encounter for screening mammogram for malignant neoplasm of breast: Secondary | ICD-10-CM

## 2020-09-28 ENCOUNTER — Encounter: Payer: Managed Care, Other (non HMO) | Admitting: Adult Health

## 2020-10-02 ENCOUNTER — Inpatient Hospital Stay: Payer: Managed Care, Other (non HMO) | Attending: Adult Health | Admitting: Adult Health

## 2020-10-02 ENCOUNTER — Encounter: Payer: Self-pay | Admitting: Adult Health

## 2020-10-02 ENCOUNTER — Other Ambulatory Visit: Payer: Self-pay

## 2020-10-02 VITALS — BP 144/84 | HR 91 | Temp 97.5°F | Resp 17 | Ht 65.0 in | Wt 190.9 lb

## 2020-10-02 DIAGNOSIS — Z923 Personal history of irradiation: Secondary | ICD-10-CM | POA: Insufficient documentation

## 2020-10-02 DIAGNOSIS — Z170 Estrogen receptor positive status [ER+]: Secondary | ICD-10-CM | POA: Diagnosis not present

## 2020-10-02 DIAGNOSIS — Z9221 Personal history of antineoplastic chemotherapy: Secondary | ICD-10-CM | POA: Insufficient documentation

## 2020-10-02 DIAGNOSIS — Z853 Personal history of malignant neoplasm of breast: Secondary | ICD-10-CM | POA: Diagnosis not present

## 2020-10-02 DIAGNOSIS — C50412 Malignant neoplasm of upper-outer quadrant of left female breast: Secondary | ICD-10-CM

## 2020-10-02 NOTE — Patient Instructions (Signed)

## 2020-10-02 NOTE — Progress Notes (Signed)
SURVIVORSHIP VISIT:    REASON FOR VISIT:  Routine follow-up for history of breast cancer.   BRIEF ONCOLOGIC HISTORY:  Oncology History  Breast cancer of upper-outer quadrant of left female breast ([place])  09/29/2006 Surgery   Left breast lumpectomy: Invasive ductal carcinoma 2.6 cm grade 21 SLN positive for micrometastatic disease 9 additional lymph nodes were negative, ER 99%, PR 90%, HER-2 1+, Ki-67 12% Oncotype DX intermediate risk    Procedure   BRCA1 and 2 testing was done and it was negative   11/30/2006 - 04/01/2007 Chemotherapy   Taxotere Cytoxan x4    Radiation Therapy   Adjuvant radiation therapy   09/01/2007 - 07/13/2018 Anti-estrogen oral therapy   Tamoxifen from 2009 to 2011 then switched to Letrozole 2.5 mg daily       INTERVAL HISTORY:  Marilyn Hansen presents to the [facility] today for routine follow-up for her history of breast cancer.  She has an upcoming mammogram scheduled for 10/09/2020.  She is doing well and is up to date with her PCP visits and cancer screenings.  She has no new breast concerns.  She asked my opinion on vitamin d intake.     REVIEW OF SYSTEMS:  Review of Systems  Constitutional: Negative for appetite change, chills, fatigue, fever and unexpected weight change.  HENT:   Negative for hearing loss, lump/mass, sore throat and trouble swallowing.   Eyes: Negative for eye problems and icterus.  Respiratory: Negative for chest tightness, cough and shortness of breath.   Cardiovascular: Negative for chest pain, leg swelling and palpitations.  Gastrointestinal: Negative for abdominal distention, abdominal pain, constipation, diarrhea, nausea and vomiting.  Endocrine: Negative for hot flashes.  Genitourinary: Negative for difficulty urinating.   Musculoskeletal: Negative for arthralgias.  Skin: Negative for itching and rash.  Neurological: Negative for dizziness, extremity weakness, headaches and numbness.  Hematological: Negative for  adenopathy. Does not bruise/bleed easily.  Psychiatric/Behavioral: Negative for depression. The patient is not nervous/anxious.   Breast: Denies any new nodularity, masses, tenderness, nipple changes, or nipple discharge.       PAST MEDICAL/SURGICAL HISTORY:  Past Medical History:  Diagnosis Date  . Breast cancer ([place]) 1/08   invasive breast cancer (T2, N1 ER+/PR+) BRCA 1/11 negative  . H/O left breast biopsy 2/07   intraductal papilloma with atypical ductal hyperplasia  . Hypertension   . Personal history of chemotherapy 2008  . Personal history of radiation therapy 2008   Past Surgical History:  Procedure Laterality Date  . AUGMENTATION MAMMAPLASTY Bilateral 2003  . BILATERAL SALPINGOOPHORECTOMY  6/10   l  . BREAST BIOPSY  2/07   breast excisional biopsy/chemo  . BREAST BIOPSY  1/14 or 2/14   right-benign  . BREAST ENHANCEMENT SURGERY  2003   saline  . REDUCTION MAMMAPLASTY Bilateral 2009  . WISDOM TOOTH EXTRACTION       ALLERGIES:  Allergies  Allergen Reactions  . Dayquil [Pseudoephedrine-Apap-Dm] Hives  . Lisinopril Cough     CURRENT MEDICATIONS:  Outpatient Encounter Medications as of 10/02/2020  Medication Sig Note  . acamprosate (CAMPRAL) 333 MG tablet TK 1 T PO TID   . atorvastatin (LIPITOR) 40 MG tablet Take 40 mg by mouth daily.   . clonazePAM (KLONOPIN) 0.5 MG tablet TAKE 1 TABLET BY MOUTH DAILY AS NEEDED   . escitalopram (LEXAPRO) 10 MG tablet 10 mg daily.   . hydrochlorothiazide (HYDRODIURIL) 25 MG tablet Take 25 mg by mouth daily.  09/05/2016: Received from: External Pharmacy  . hydrOXYzine (ATARAX/VISTARIL)  25 MG tablet Take 25 mg by mouth 3 (three) times daily as needed.   . Multiple Vitamins-Minerals (MULTIVITAMIN PO) Take by mouth daily.   . traZODone (DESYREL) 50 MG tablet Take 1 tablet (50 mg total) by mouth at bedtime as needed.   . [DISCONTINUED] cholecalciferol (VITAMIN D3) 25 MCG (1000 UNIT) tablet Take 25 mcg by mouth daily. (Patient not  taking: Reported on 10/02/2020)   . [DISCONTINUED] lovastatin (MEVACOR) 40 MG tablet Take 40 mg by mouth at bedtime.  (Patient not taking: Reported on 10/02/2020) 09/05/2016: Received from: External Pharmacy  . [DISCONTINUED] Multiple Vitamins-Minerals (ONE-A-DAY WOMENS) tablet Take 1 tablet by mouth See admin instructions. (Patient not taking: Reported on 10/02/2020)    No facility-administered encounter medications on file as of 10/02/2020.     ONCOLOGIC FAMILY HISTORY:  Family History  Problem Relation Age of Onset  . Hypertension Maternal Grandmother   . Heart disease Maternal Grandmother   . Hypertension Paternal Grandmother   . Heart disease Paternal Grandmother   . Alzheimer's disease Father   . Anemia Mother   . Osteoporosis Mother   . Alcohol abuse Mother     GENETIC COUNSELING/TESTING: negative  SOCIAL HISTORY:  Social History   Socioeconomic History  . Marital status: Married    Spouse name: Not on file  . Number of children: Not on file  . Years of education: Not on file  . Highest education level: Not on file  Occupational History  . Not on file  Tobacco Use  . Smoking status: Never Smoker  . Smokeless tobacco: Never Used  Vaping Use  . Vaping Use: Never used  Substance and Sexual Activity  . Alcohol use: Yes    Comment: 10  . Drug use: No  . Sexual activity: Not Currently    Partners: Male    Birth control/protection: Post-menopausal  Other Topics Concern  . Not on file  Social History Narrative  . Not on file   Social Determinants of Health   Financial Resource Strain: Not on file  Food Insecurity: Not on file  Transportation Needs: Not on file  Physical Activity: Not on file  Stress: Not on file  Social Connections: Not on file  Intimate Partner Violence: Not on file      OBJECTIVE:  BP (!) 144/84 (BP Location: Right Arm, Patient Position: Sitting)   Pulse 91   Temp (!) 97.5 F (36.4 C) (Temporal)   Resp 17   Ht '5\' 5"'  (1.651 m)   Wt  190 lb 14.4 oz (86.6 kg)   LMP 11/21/2006   SpO2 98%   BMI 31.77 kg/m   GENERAL: Patient is a well appearing female in no acute distress HEENT:  Sclerae anicteric. Mask in place. Neck is supple.  NODES:  No cervical, supraclavicular, or axillary lymphadenopathy palpated.  BREAST EXAM: right breast benign, left breast s/p lumpectomy and radiation no sign of local recurrence. LUNGS:  Clear to auscultation bilaterally.  No wheezes or rhonchi. HEART:  Regular rate and rhythm. No murmur appreciated. ABDOMEN:  Soft, nontender.  Positive, normoactive bowel sounds. No organomegaly palpated. MSK:  No focal spinal tenderness to palpation. Full range of motion bilaterally in the upper extremities. EXTREMITIES:  No peripheral edema.   SKIN:  Clear with no obvious rashes or skin changes. No nail dyscrasia. NEURO:  Nonfocal. Well oriented.  Appropriate affect.    LABORATORY DATA:  None for this visit   DIAGNOSTIC IMAGING:  Most recent mammogram:  CLINICAL DATA:  Screening.  EXAM: DIGITAL SCREENING BILATERAL MAMMOGRAM WITH TOMO AND CAD  COMPARISON:  Previous exam(s).  ACR Breast Density Category b: There are scattered areas of fibroglandular density.  FINDINGS: There are no findings suspicious for malignancy. Images were processed with CAD.  IMPRESSION: No mammographic evidence of malignancy. A result letter of this screening mammogram will be mailed directly to the patient.  RECOMMENDATION: Screening mammogram in one year. (Code:SM-B-01Y)  BI-RADS CATEGORY  1: Negative.   Electronically Signed   By: Franki Cabot M.D.   On: 10/05/2018 09:53     ASSESSMENT AND PLAN:  Marilyn Hansen is a pleasant 65 y.o. female with history of Stage IIA left breast invasive ductal carcinoma, ER+/PR+/HER2-, diagnosed in 09/2006, treated with lumpectomy, adjuvant chemotherapy, adjuvant radiation therapy, and anti-estrogen therapy with Tamoxifen, followed by Letrozole completed in  06/2018.  [facility] and routine follow-up.   1. History of breast cancer:  Marilyn Hansen is currently clinically and radiographically without evidence of disease or recurrence of breast cancer. She will be due for mammogram in 09/2020.  We discussed whether or not she would like to follow-up annually and she has opted to f/u with Dr. Sabra Heck.  I let Marilyn Hansen know that we can see her at any point in the future should the need arise.  2. Bone health:  She was given education on specific food and activities to promote bone health.  WE discussed vitamin d and I recommended that she keep an eye on her levels after she completes her supplements, as vitamin d is important for bone health, and low levels can lead to fatigue and brain fog.    3. Cancer screening:  Due to Marilyn Hansen's history and her age, she should receive screening for skin cancers, colon cancer, and gynecologic cancers. She was encouraged to follow-up with her PCP for appropriate cancer screenings.   4. Health maintenance and wellness promotion: Marilyn Hansen was encouraged to consume 5-7 servings of fruits and vegetables per day. She was also encouraged to engage in moderate to vigorous exercise for 30 minutes per day most days of the week. She was instructed to limit her alcohol consumption and continue to abstain from tobacco use.     Follow up instructions:    -Return to cancer center PRN -Mammogram due in 09/2020   The patient was provided an opportunity to ask questions and all were answered. The patient agreed with the plan and demonstrated an understanding of the instructions.   Total encounter time: 20 minutes*  Wilber Bihari, NP 10/02/20 10:53 AM Medical Oncology and Hematology [facility] [place], [place] [address] Tel. [phone]    Fax. [phone]  *Total Encounter Time as defined by the Centers for Medicare and Medicaid Services includes, in  addition to the face-to-face time of a patient visit (documented in the note above) non-face-to-face time: obtaining and reviewing outside history, ordering and reviewing medications, tests or procedures, care coordination (communications with other health care professionals or caregivers) and documentation in the medical record.   Note: PRIMARY CARE PROVIDER Kathyrn Lass, [place] [phone]

## 2020-10-05 ENCOUNTER — Telehealth: Payer: Self-pay | Admitting: Adult Health

## 2020-10-05 NOTE — Telephone Encounter (Signed)
No 2/11 los. No changes made to pt's schedule.

## 2020-10-09 ENCOUNTER — Other Ambulatory Visit: Payer: Self-pay

## 2020-10-09 ENCOUNTER — Ambulatory Visit
Admission: RE | Admit: 2020-10-09 | Discharge: 2020-10-09 | Disposition: A | Discharge status: Home / Self Care | Payer: Managed Care, Other (non HMO) | Source: Ambulatory Visit | Attending: Hematology and Oncology | Admitting: Hematology and Oncology

## 2020-10-09 DIAGNOSIS — Z1231 Encounter for screening mammogram for malignant neoplasm of breast: Secondary | ICD-10-CM

## 2020-10-15 ENCOUNTER — Other Ambulatory Visit: Payer: Self-pay | Admitting: Hematology and Oncology

## 2020-10-15 DIAGNOSIS — R928 Other abnormal and inconclusive findings on diagnostic imaging of breast: Secondary | ICD-10-CM

## 2020-10-28 ENCOUNTER — Ambulatory Visit
Admission: RE | Admit: 2020-10-28 | Discharge: 2020-10-28 | Disposition: A | Discharge status: Home / Self Care | Payer: Managed Care, Other (non HMO) | Source: Ambulatory Visit | Attending: Hematology and Oncology | Admitting: Hematology and Oncology

## 2020-10-28 ENCOUNTER — Ambulatory Visit: Payer: Managed Care, Other (non HMO)

## 2020-10-28 ENCOUNTER — Other Ambulatory Visit: Payer: Self-pay

## 2020-10-28 DIAGNOSIS — R928 Other abnormal and inconclusive findings on diagnostic imaging of breast: Secondary | ICD-10-CM

## 2021-01-02 ENCOUNTER — Other Ambulatory Visit: Payer: Self-pay | Admitting: Obstetrics & Gynecology

## 2021-07-29 ENCOUNTER — Ambulatory Visit (HOSPITAL_BASED_OUTPATIENT_CLINIC_OR_DEPARTMENT_OTHER): Payer: Managed Care, Other (non HMO) | Admitting: Obstetrics & Gynecology

## 2021-07-29 NOTE — Progress Notes (Deleted)
65 y.o. G0P0000 Married White or Caucasian female here for annual exam.    Patient's last menstrual period was 11/21/2006.          Sexually active: {yes no:314532}  The current method of family planning is {contraception:315051}.     The pregnancy intention screening data noted above was reviewed. Potential methods of contraception were discussed. The patient elected to proceed with No data recorded.  Exercising: {yes no:314532}  {types:19826} Smoker:  {YES [id number]  Health Maintenance: Pap:  05/15/2020 Negative History of abnormal Pap:  {YES NO:22349} MMG:  10/09/2020 Additional Images recommended. (10/28/2020) Colonoscopy:  04/05/2018 BMD:   06/05/2020 Osteopenia Screening Labs: ***   reports that she has never smoked. She has never used smokeless tobacco. She reports current alcohol use. She reports that she does not use drugs.  Past Medical History:  Diagnosis Date   Breast cancer ([place]) 1/08   invasive breast cancer (T2, N1 ER+/PR+) BRCA 1/11 negative   H/O left breast biopsy 2/07   intraductal papilloma with atypical ductal hyperplasia   Hypertension    Personal history of chemotherapy 2008   Personal history of radiation therapy 2008    Past Surgical History:  Procedure Laterality Date   AUGMENTATION MAMMAPLASTY Bilateral 2003   BILATERAL SALPINGOOPHORECTOMY  6/10   l   BREAST BIOPSY  2/07   breast excisional biopsy/chemo   BREAST BIOPSY  1/14 or 2/14   right-benign   BREAST ENHANCEMENT SURGERY  2003   saline   BREAST LUMPECTOMY Left 2008   REDUCTION MAMMAPLASTY Bilateral 2009   WISDOM TOOTH EXTRACTION      Current Outpatient Medications  Medication Sig Dispense Refill   acamprosate (CAMPRAL) 333 MG tablet TK 1 T PO TID     atorvastatin (LIPITOR) 40 MG tablet Take 40 mg by mouth daily.     clonazePAM (KLONOPIN) 0.5 MG tablet TAKE 1 TABLET BY MOUTH DAILY AS NEEDED 30 tablet 0   escitalopram (LEXAPRO) 10 MG tablet 10 mg daily.     hydrochlorothiazide  (HYDRODIURIL) 25 MG tablet Take 25 mg by mouth daily.      hydrOXYzine (ATARAX/VISTARIL) 25 MG tablet Take 25 mg by mouth 3 (three) times daily as needed.     Multiple Vitamins-Minerals (MULTIVITAMIN PO) Take by mouth daily.     traZODone (DESYREL) 50 MG tablet TAKE 1 TABLET BY MOUTH AT BEDTIME AS NEEDED 90 tablet 4   No current facility-administered medications for this visit.    Family History  Problem Relation Age of Onset   Hypertension Maternal Grandmother    Heart disease Maternal Grandmother    Hypertension Paternal Grandmother    Heart disease Paternal Grandmother    Alzheimer's disease Father    Anemia Mother    Osteoporosis Mother    Alcohol abuse Mother     Review of Systems  Exam:   LMP 11/21/2006      General appearance: alert, cooperative and appears stated age Head: Normocephalic, without obvious abnormality, atraumatic Neck: no adenopathy, supple, symmetrical, trachea midline and thyroid {EXAM; THYROID:18604} Lungs: clear to auscultation bilaterally Breasts: {Exam; breast:13139::"normal appearance, no masses or tenderness"} Heart: regular rate and rhythm Abdomen: soft, non-tender; bowel sounds normal; no masses,  no organomegaly Extremities: extremities normal, atraumatic, no cyanosis or edema Skin: Skin color, texture, turgor normal. No rashes or lesions Lymph nodes: Cervical, supraclavicular, and axillary nodes normal. No abnormal inguinal nodes palpated Neurologic: Grossly normal   Pelvic: External genitalia:  no lesions  Urethra:  normal appearing urethra with no masses, tenderness or lesions              Bartholins and Skenes: normal                 Vagina: normal appearing vagina with normal color and no discharge, no lesions              Cervix: {exam; cervix:14595}              Pap taken: {yes no:314532} Bimanual Exam:  Uterus:  {exam; uterus:12215}              Adnexa: {exam; adnexa:12223}               Rectovaginal: Confirms                Anus:  normal sphincter tone, no lesions  Chaperone, ***, CMA, was present for exam.  Assessment/Plan:

## 2021-09-01 ENCOUNTER — Ambulatory Visit (HOSPITAL_BASED_OUTPATIENT_CLINIC_OR_DEPARTMENT_OTHER): Payer: Managed Care, Other (non HMO) | Admitting: Obstetrics & Gynecology

## 2021-09-23 ENCOUNTER — Other Ambulatory Visit: Payer: Self-pay

## 2021-09-23 ENCOUNTER — Ambulatory Visit (INDEPENDENT_AMBULATORY_CARE_PROVIDER_SITE_OTHER): Payer: Managed Care, Other (non HMO) | Admitting: Obstetrics & Gynecology

## 2021-09-23 ENCOUNTER — Encounter (HOSPITAL_BASED_OUTPATIENT_CLINIC_OR_DEPARTMENT_OTHER): Payer: Self-pay | Admitting: Obstetrics & Gynecology

## 2021-09-23 VITALS — BP 132/68 | HR 81 | Ht 64.5 in | Wt 189.6 lb

## 2021-09-23 DIAGNOSIS — Z170 Estrogen receptor positive status [ER+]: Secondary | ICD-10-CM | POA: Diagnosis not present

## 2021-09-23 DIAGNOSIS — N393 Stress incontinence (female) (male): Secondary | ICD-10-CM | POA: Diagnosis not present

## 2021-09-23 DIAGNOSIS — Z853 Personal history of malignant neoplasm of breast: Secondary | ICD-10-CM

## 2021-09-23 DIAGNOSIS — Z780 Asymptomatic menopausal state: Secondary | ICD-10-CM

## 2021-09-23 DIAGNOSIS — C50412 Malignant neoplasm of upper-outer quadrant of left female breast: Secondary | ICD-10-CM

## 2021-09-23 DIAGNOSIS — G4700 Insomnia, unspecified: Secondary | ICD-10-CM | POA: Diagnosis not present

## 2021-09-23 DIAGNOSIS — Z01419 Encounter for gynecological examination (general) (routine) without abnormal findings: Secondary | ICD-10-CM | POA: Diagnosis not present

## 2021-09-23 DIAGNOSIS — M8580 Other specified disorders of bone density and structure, unspecified site: Secondary | ICD-10-CM | POA: Diagnosis not present

## 2021-09-23 MED ORDER — TRAZODONE HCL 50 MG PO TABS: 50.0000 mg | ORAL_TABLET | Freq: Every evening | ORAL | 4 refills | Status: DC | PRN

## 2021-09-23 NOTE — Progress Notes (Signed)
66 y.o. G0P0000 Married White or Caucasian female here for annual exam.  Denies vaginal bleeding.  Having some incontinence with cough.  Would like recommendations.  Almost [place] with [place] all 50 states.    Patient's last menstrual period was 11/21/2006.          Sexually active: No.  The current method of family planning is post menopausal status.    Exercising: No.  Smoker:  no  Health Maintenance: Pap:  05/15/2020 Negative History of abnormal Pap:  no MMG:  10/09/2020 Need additional imaging Colonoscopy:  04/05/2018, follow up 3 years.  She is scheduled BMD:   06/05/2020 Osteopenia Screening Labs: does with Dr. Kathyrn Lass   reports that she has never smoked. She has never used smokeless tobacco. She reports current alcohol use. She reports that she does not use drugs.  Past Medical History:  Diagnosis Date   Breast cancer ([place]) 1/08   invasive breast cancer (T2, N1 ER+/PR+) BRCA 1/11 negative   H/O left breast biopsy 2/07   intraductal papilloma with atypical ductal hyperplasia   Hypertension    Personal history of chemotherapy 2008   Personal history of radiation therapy 2008    Past Surgical History:  Procedure Laterality Date   AUGMENTATION MAMMAPLASTY Bilateral 2003   BILATERAL SALPINGOOPHORECTOMY  6/10   l   BREAST BIOPSY  2/07   breast excisional biopsy/chemo   BREAST BIOPSY  1/14 or 2/14   right-benign   BREAST ENHANCEMENT SURGERY  2003   saline   BREAST LUMPECTOMY Left 2008   REDUCTION MAMMAPLASTY Bilateral 2009   WISDOM TOOTH EXTRACTION      Current Outpatient Medications  Medication Sig Dispense Refill   Ascorbic Acid (VITAMIN C) 1000 MG tablet Take 1,000 mg by mouth daily.     atorvastatin (LIPITOR) 40 MG tablet Take 40 mg by mouth daily.     Cholecalciferol (VITAMIN D3) 1.25 MG (50000 UT) CAPS Take by mouth.     escitalopram (LEXAPRO) 10 MG tablet 10 mg daily.     hydrochlorothiazide (HYDRODIURIL) 25 MG tablet Take 25 mg by mouth daily.      zinc  gluconate 50 MG tablet Take 50 mg by mouth daily.     acamprosate (CAMPRAL) 333 MG tablet TK 1 T PO TID (Patient not taking: Reported on 09/23/2021)     Multiple Vitamins-Minerals (MULTIVITAMIN PO) Take by mouth daily. (Patient not taking: Reported on 09/23/2021)     traZODone (DESYREL) 50 MG tablet Take 1 tablet (50 mg total) by mouth at bedtime as needed. 90 tablet 4   No current facility-administered medications for this visit.    Family History  Problem Relation Age of Onset   Hypertension Maternal Grandmother    Heart disease Maternal Grandmother    Hypertension Paternal Grandmother    Heart disease Paternal Grandmother    Alzheimer's disease Father    Anemia Mother    Osteoporosis Mother    Alcohol abuse Mother     Review of Systems  All other systems reviewed and are negative.  Exam:   BP 132/68 (BP Location: Right Arm, Patient Position: Sitting, Cuff Size: Large)    Pulse 81    Ht 5' 4.5" (1.638 m)    Wt 189 lb 9.6 oz (86 kg)    LMP 11/21/2006    BMI 32.04 kg/m   Height: 5' 4.5" (163.8 cm)  General appearance: alert, cooperative and appears stated age Head: Normocephalic, without obvious abnormality, atraumatic Neck: no adenopathy, supple, symmetrical, trachea  midline and thyroid normal to inspection and palpation Lungs: clear to auscultation bilaterally Breasts: left breast with well healed scarring with radiation changes that are stable, no nipple discharge or LAD; right breast without any masses, skin changes, LAD, nipple discharge Heart: regular rate and rhythm Abdomen: soft, non-tender; bowel sounds normal; no masses,  no organomegaly Extremities: extremities normal, atraumatic, no cyanosis or edema Skin: Skin color, texture, turgor normal. No rashes or lesions Lymph nodes: Cervical, supraclavicular, and axillary nodes normal. No abnormal inguinal nodes palpated Neurologic: Grossly normal   Pelvic: External genitalia:  no lesions              Urethra:  normal  appearing urethra with no masses, tenderness or lesions              Bartholins and Skenes: normal                 Vagina: normal appearing vagina with normal color and no discharge, no lesions              Cervix: no lesions              Pap taken: No. Bimanual Exam:  Uterus:  normal size, contour, position, consistency, mobility, non-tender              Adnexa: normal adnexa and no mass, fullness, tenderness               Rectovaginal: Confirms               Anus:  normal sphincter tone, no lesions  Chaperone, Octaviano Batty, CMA, was present for exam.  Assessment/Plan: 1. Well woman exam with routine gynecological exam - pap neg 04/2020.  Not indicated today. - MMG 09/2020 - colonoscopy 03/2018.  Follow up 3 years recommended.  She is scheduled for this for 2023. - BMD 2021 with osteopenia.  Consider repeating next year. - vaccines reviewed/updated - lab work done with PCP, Dr. Kathyrn Lass  2. Osteopenia, unspecified location  3. Stress incontinence - Ambulatory referral to Physical Therapy  4. Malignant neoplasm of upper-outer quadrant of left breast in female, estrogen receptor positive ([place])  5. Postmenopausal  6. Insomnia, unspecified type - Rx for Trazodone 48m nightly refilled today

## 2021-09-24 ENCOUNTER — Other Ambulatory Visit: Payer: Self-pay | Admitting: Obstetrics & Gynecology

## 2021-09-24 ENCOUNTER — Other Ambulatory Visit: Payer: Self-pay | Admitting: Family Medicine

## 2021-09-24 DIAGNOSIS — Z1231 Encounter for screening mammogram for malignant neoplasm of breast: Secondary | ICD-10-CM

## 2021-09-24 DIAGNOSIS — M8580 Other specified disorders of bone density and structure, unspecified site: Secondary | ICD-10-CM

## 2021-10-14 ENCOUNTER — Encounter: Payer: Self-pay | Admitting: Physical Therapy

## 2021-10-14 ENCOUNTER — Other Ambulatory Visit: Payer: Self-pay

## 2021-10-14 ENCOUNTER — Ambulatory Visit: Payer: Managed Care, Other (non HMO) | Attending: Obstetrics & Gynecology | Admitting: Physical Therapy

## 2021-10-14 DIAGNOSIS — N393 Stress incontinence (female) (male): Secondary | ICD-10-CM | POA: Diagnosis not present

## 2021-10-14 DIAGNOSIS — R293 Abnormal posture: Secondary | ICD-10-CM | POA: Diagnosis not present

## 2021-10-14 DIAGNOSIS — M6281 Muscle weakness (generalized): Secondary | ICD-10-CM | POA: Insufficient documentation

## 2021-10-14 DIAGNOSIS — R279 Unspecified lack of coordination: Secondary | ICD-10-CM | POA: Diagnosis not present

## 2021-10-14 NOTE — Patient Instructions (Addendum)

## 2021-10-14 NOTE — Therapy (Signed)
OUTPATIENT PHYSICAL THERAPY FEMALE PELVIC EVALUATION   Patient Name: Marilyn Hansen MRN: [id number] DOB:07-22-1956, 66 y.o., female Today's Date: 10/14/2021   PT End of Session - 10/14/21 1447     Visit Number 1    Date for PT Re-Evaluation 01/11/22    Authorization Type cigna    PT Start Time 1400    PT Stop Time 1445    PT Time Calculation (min) 45 min    Activity Tolerance Patient tolerated treatment well    Behavior During Therapy [facility] for tasks assessed/performed             Past Medical History:  Diagnosis Date   Breast cancer ([place]) 1/08   invasive breast cancer (T2, N1 ER+/PR+) BRCA 1/11 negative   H/O left breast biopsy 2/07   intraductal papilloma with atypical ductal hyperplasia   Hypertension    Personal history of chemotherapy 2008   Personal history of radiation therapy 2008   Past Surgical History:  Procedure Laterality Date   AUGMENTATION MAMMAPLASTY Bilateral 2003   BILATERAL SALPINGOOPHORECTOMY  6/10   l   BREAST BIOPSY  2/07   breast excisional biopsy/chemo   BREAST BIOPSY  1/14 or 2/14   right-benign   BREAST ENHANCEMENT SURGERY  2003   saline   BREAST LUMPECTOMY Left 2008   REDUCTION MAMMAPLASTY Bilateral 2009   WISDOM TOOTH EXTRACTION     Patient Active Problem List   Diagnosis Date Noted   Fatty liver 09/05/2016   Essential hypertension 09/05/2016   Insomnia 09/05/2016   Anxiety state 09/05/2016   Breast cancer of upper-outer quadrant of left female breast ([place]) 01/16/2014    PCP: Kathyrn Lass, MD  REFERRING PROVIDER: Megan Salon, MD  REFERRING DIAG: N39.3 (ICD-10-CM) - Stress incontinence  THERAPY DIAG:  Muscle weakness (generalized)  Unspecified lack of coordination  Abnormal posture  ONSET DATE: 08/2021  SUBJECTIVE:                                                                                                                                                                                           SUBJECTIVE  STATEMENT: Pt reports she got sick on vacation with lots of coughing and starting to have leakage with urine and sometimes will have stool leakage with diarrhea. Will have diarrhea frequently sometimes will have Bms 3 per day and other times normal type 4 stools.  Fluid intake: No   Patient confirms identification and approves PT to assess pelvic floor and treatment Yes  PERTINENT HISTORY:  Malignant neoplasm of upper-outer quadrant of left breast in female, estrogen receptor positive (lumpectomy 09/29/06, chemo/radiation 2008) Sexual abuse: No  PAIN:  Are you having pain? No    BOWEL MOVEMENT Pain with bowel movement: No Type of bowel movement:Type (Bristol Stool Scale) 7 with diarrhea and other times 4.  Fully empty rectum: No Sometimes feels empty but other needs to strain or feels empty then returns in 20 mins; sometimes also feels like she has no warning and goes very quickly Leakage: Yes: once with small fecal incontinence, no stressors Pads: No Fiber supplement: No  URINATION Pain with urination: No Fully empty bladder: Yes:   Stream: Strong Urgency: Yes:   Frequency: about every 2 hours, sometimes will get up 1x per night.  Leakage: Coughing, Sneezing, and Laughing Pads: No  INTERCOURSE Pain with intercourse:  yes from dryness Ability to have vaginal penetration:  Yes:   Types of stimulation: no pain  Climax: Yes: no pain Marinoff Scale 0  PREGNANCY Vaginal deliveries 0   PROLAPSE None  PRECAUTIONS: None  WEIGHT BEARING RESTRICTIONS No  FALLS:  Has patient fallen in last 6 months? No, Number of falls: 0  LIVING ENVIRONMENT: Lives with: lives with their spouse Lives in: House/apartment  Has following equipment at home: None  OCCUPATION: retired  PLOF: Independent  PATIENT GOALS to have less leakage   OBJECTIVE:       COGNITION:  Overall cognitive status: Within functional limits for tasks assessed     SENSATION:  Light touch: Appears  intact  Proprioception: Appears intact  MUSCLE LENGTH: Hamstring and adductors limited by 25%   POSTURE:  Rounded shoulders, posterior pelvic tilt  PALPATION: Internal Pelvic Floor mild TTP at Rt pubococcygeus and obturator internus however pt reported only slight discomfort (1/10) and nothing on Lt.  External Perineal Exam WFL, mild dryness  GENERAL WFL  LUMBARAROM/PROM  A/PROM A/PROM  10/14/2021  Flexion Limited by 25%  Extension WFL  Right lateral flexion WFL  Left lateral flexion WFL  Right rotation WFL  Left rotation WFL   (Blank rows = not tested)  LE AROM/PROM:  All WFL  LE MMT:  MMT Right 10/14/2021 Left 10/14/2021  Hip flexion 5/5 5/5  Hip extension 5/5 5/5  Hip abduction 4/5 4/5  Hip adduction 5/5 5/5  Hip internal rotation 5/5 5/5  Hip external rotation 5/5 5/5  Knee flexion 5/5 5/5  Knee extension 5/5 5/5  Ankle dorsiflexion 5/5 5/5  Ankle plantarflexion 5/5 5/5  Ankle inversion 5/5 5/5  Ankle eversion 5/5 5/5   PELVIC MMT:   MMT  10/14/2021  Vaginal 4/5; isometric hold: 5s; reps 7  Internal Anal Sphincter   External Anal Sphincter   Puborectalis   Diastasis Recti   (Blank rows = not tested)  Also during vaginal assessment pt demonstrated tightening of pelvic floor with attempts to bulge  TONE: WFL, mild tightness at Rt  pubococcygeus and obturator internus  PROLAPSE: None     FUNCTIONAL TESTS:  Functional squat assessed with decreased ability to complete body weight squat evenly without compensation of forward trunk and one UE to push from legs.   GAIT: WFL    TODAY'S TREATMENT  EVAL educated on proper pelvic contraction techniques and coordination of pelvic floor and breathing   PATIENT EDUCATION:  Education details: Educated on HEP, abdominal massage, fiber types, voiding mechanics Person educated: Patient Education method: Explanation, Demonstration, Tactile cues, Verbal cues, and Handouts Education comprehension:  verbalized understanding, returned demonstration, and verbal cues required   HOME EXERCISE PROGRAM: [id number]    ASSESSMENT:  CLINICAL IMPRESSION: Patient is a 66 y.o. female who  was seen today for physical therapy evaluation and treatment for stress incontinence.  Pt reports she also has fecal incontinence on one recent occasion and has irregular bowels with sometimes voiding with type 7 stool up to 3x daily and other times 1-2x daily with type 4, does sometimes need to strain. Pt reports she sometimes doesn't feel like she fully empties and sometimes does but then 20 mins later will have another urge. Pt also reports she sometimes will stand after urination and have more to empty, only has had urinary leakage with sneezing, laughing, coughing but this has somewhat improved since she meet with MD. Pt found to have decreased mobility in spinal flexion, decreased bil hip abduction strength, decreased core strength and mild abdominal fascial restrictions in upper quadrants. Pt did consent to internal vaginal assessment and found to have decreased strength, coordination and endurance. Pt would benefit from additional PT to further address deficits.     OBJECTIVE IMPAIRMENTS decreased coordination, decreased endurance, decreased strength, increased fascial restrictions, improper body mechanics, and postural dysfunction.   ACTIVITY LIMITATIONS community activity.   PERSONAL FACTORS Fitness are also affecting patient's functional outcome.    REHAB POTENTIAL: Good  CLINICAL DECISION MAKING: Stable/uncomplicated  EVALUATION COMPLEXITY: Low   GOALS: Goals reviewed with patient? Yes  SHORT TERM GOALS:  STG Name Target Date Goal status  1 Pt to be I with HEP.  Baseline:   11/11/2021 INITIAL  2 Pt to demonstrate at least 4/5 pelvic floor strength and ability to hold for 8s for decreased leakage.  Baseline:  11/11/2021 INITIAL  3 Pt to report no more than 2 Bms per day at type 4-5 for improve  consistency with bowel habits and decreased leakage.  Baseline: 11/11/2021 INITIAL  4  Baseline:    5  Baseline:    6  Baseline:    7  Baseline:     LONG TERM GOALS:   LTG Name Target Date Goal status  1 Pt to be I with advanced HEP.  Baseline: 01/11/22 INITIAL  2 Pt to demonstrate at least 5/5 pelvic floor strength and ability to hold for 8s for decreased leakage.  Baseline: 01/11/22 INITIAL  3 Pt to report no more than 2 Bms per day at type 3-4 for improve consistency with bowel habits and decreased leakage.  Baseline: 01/11/22 INITIAL  4 Pt to demonstrate at least 5/5 bil hip strength for improved pelvic stability and functional squats without leakage.  Baseline: 01/11/22 INITIAL  5  Baseline:    6  Baseline:    7  Baseline:     PLAN: PT FREQUENCY:  once every other week   PT DURATION:  4 sessions  PLANNED INTERVENTIONS: Therapeutic exercises, Therapeutic activity, Neuro Muscular re-education, Balance training, Gait training, Patient/Family education, Joint mobilization, scar mobilization, Taping, and Manual therapy  PLAN FOR NEXT SESSION: go over all handouts and additional education and reps with voiding mechanics, abdominal massage and breathing coordination   No emotional/communication barriers or cognitive limitation. Patient is motivated to learn. Patient understands and agrees with treatment goals and plan. PT explains patient will be examined in standing, sitting, and lying down to see how their muscles and joints work. When they are ready, they will be asked to remove their underwear so PT can examine their perineum. The patient is also given the option of providing their own chaperone as one is not provided in our facility. The patient also has the right and is explained the right to defer or refuse  any part of the evaluation or treatment including the internal exam. With the patient's consent, PT will use one gloved finger to gently assess the muscles of the pelvic  floor, seeing how well it contracts and relaxes and if there is muscle symmetry. After, the patient will get dressed and PT and patient will discuss exam findings and plan of care. PT and patient discuss plan of care, schedule, attendance policy and HEP activities.   Stacy Gardner, PT, DPT 02/23/233:03 PM

## 2021-10-19 ENCOUNTER — Ambulatory Visit
Admission: RE | Admit: 2021-10-19 | Discharge: 2021-10-19 | Disposition: A | Discharge status: Home / Self Care | Payer: Managed Care, Other (non HMO) | Source: Ambulatory Visit | Attending: Family Medicine | Admitting: Family Medicine

## 2021-10-19 DIAGNOSIS — Z1231 Encounter for screening mammogram for malignant neoplasm of breast: Secondary | ICD-10-CM

## 2021-10-21 ENCOUNTER — Other Ambulatory Visit: Payer: Self-pay | Admitting: Family Medicine

## 2021-10-21 DIAGNOSIS — R928 Other abnormal and inconclusive findings on diagnostic imaging of breast: Secondary | ICD-10-CM

## 2021-11-09 ENCOUNTER — Ambulatory Visit
Admission: RE | Admit: 2021-11-09 | Discharge: 2021-11-09 | Disposition: A | Discharge status: Home / Self Care | Payer: Managed Care, Other (non HMO) | Source: Ambulatory Visit | Attending: Family Medicine | Admitting: Family Medicine

## 2021-11-09 ENCOUNTER — Other Ambulatory Visit: Payer: Self-pay | Admitting: Family Medicine

## 2021-11-09 DIAGNOSIS — R928 Other abnormal and inconclusive findings on diagnostic imaging of breast: Secondary | ICD-10-CM

## 2021-11-09 DIAGNOSIS — N6489 Other specified disorders of breast: Secondary | ICD-10-CM

## 2021-11-09 DIAGNOSIS — N6310 Unspecified lump in the right breast, unspecified quadrant: Secondary | ICD-10-CM

## 2021-11-29 ENCOUNTER — Ambulatory Visit
Admission: RE | Admit: 2021-11-29 | Discharge: 2021-11-29 | Disposition: A | Discharge status: Home / Self Care | Payer: Managed Care, Other (non HMO) | Source: Ambulatory Visit | Attending: Family Medicine | Admitting: Family Medicine

## 2021-11-29 DIAGNOSIS — N6489 Other specified disorders of breast: Secondary | ICD-10-CM

## 2021-11-29 DIAGNOSIS — N6310 Unspecified lump in the right breast, unspecified quadrant: Secondary | ICD-10-CM

## 2021-12-01 ENCOUNTER — Encounter: Payer: Self-pay | Admitting: *Deleted

## 2021-12-02 ENCOUNTER — Telehealth: Payer: Self-pay | Admitting: Hematology and Oncology

## 2021-12-02 NOTE — Telephone Encounter (Signed)
Spoke to patient to confirm afternoon clinic appointment for 4/19, packet will be mailed to patient ?

## 2021-12-03 ENCOUNTER — Other Ambulatory Visit: Payer: Self-pay | Admitting: *Deleted

## 2021-12-03 DIAGNOSIS — Z170 Estrogen receptor positive status [ER+]: Secondary | ICD-10-CM

## 2021-12-07 NOTE — Progress Notes (Signed)
?Radiation Oncology         [phone] ?________________________________ ? ?Initial Outpatient Consultation ? ?Name: Marilyn Hansen MRN: [id number]  ?Date: 12/08/2021  DOB: 1955-09-29 ? ?[id number], Lattie Haw, MD  [name], MD  ? ?REFERRING PHYSICIAN: [name], MD ? ?DIAGNOSIS:  ?  ICD-10-CM   ?1. Malignant neoplasm of upper-outer quadrant of right breast in female, estrogen receptor positive ([place])  C50.411   ? Z17.0   ?  ? ? Cancer Staging  ?Breast cancer of upper-outer quadrant of left female breast ([place]) ?Staging form: Breast, AJCC 7th Edition ?- Clinical: No stage assigned - Unsigned ?Laterality: Left ?Tumor size (mm): 26 ?- Pathologic: Stage IIA (T2, N0, cM0) - Signed by Minette Headland, NP on 01/16/2014 ?Laterality: Left ?Tumor size (mm): 26 ? ?Malignant neoplasm of upper-outer quadrant of right breast in female, estrogen receptor positive ([place]) ?Staging form: Breast, AJCC 8th Edition ?- Clinical stage from 12/08/2021: Stage IA (cT1b, cN0, cM0, G2, ER+, PR+, HER2-) - Signed by Benay Pike, MD on 12/08/2021 ?Stage prefix: Initial diagnosis ?Histologic grading system: 3 grade system ?  ?CHIEF COMPLAINT: Here to discuss management of right breast cancer ? ?HISTORY OF PRESENT ILLNESS::Marilyn Hansen is a 66 y.o. female who presented with a right breast abnormality on the following imaging: bilateral screening mammogram on the date of 10/19/21.  No symptoms, if any, were reported at that time, though the patient has a history notable for stage IIA left breast invasive ductal carcinoma; s/p chemotherapy and radiation completed in 2009, and letrozole for 10 years completed in 2019.  Right breast diagnostic mammogram and ultrasound performed on 11/09/21 further revealed a suspicious 1 cm mass in the 11 o'clock position of the right breast, 4 cmfn, and a possible subtle distortion in the slightly outer right breast. No abnormal right axillary lymph nodes were appreciated.  ? ?Biopsy of the 11 o'clock  right breast mass on the date of 11/29/21 showed grade 2 invasive ductal carcinoma measuring 0.8 cm in the greatest dimension, with intermediate grade DCIS.  ER status: 100% positive; PR status 100% positive (both with strong staining intensity); Proliferation marker Ki67 at 10%; Her2 status negative; Grade 2. ? ?An additional biopsy of the upper outer right breast distortions showed findings consistent with a complex sclerosing lesion and fibrocystic changes with usual ductal hyperplasia.  ? ?She previously underwent treatment for left breast cancer in 2008-09, see below. ? ?PREVIOUS RADIATION THERAPY: Yes, for left breast invasive ductal carcinoma T2, N1, M0 stage IIB ER/PR positive HER-2 negative; BRCA1 and 2 negative; Oncotype DX intermediate risk; received 4 cycles of Taxotere and Cytoxan followed by radiation that was completed in 2009; and started on tamoxifen for 2 years and then letrozole 2.5 mg daily. Completed 10 years of letrozole therapy by November 2019 ? ?PAST MEDICAL HISTORY:  has a past medical history of Breast cancer ([place]) (08/2006), H/O left breast biopsy (09/2005), Hyperlipemia, Hypertension, Personal history of chemotherapy (2008), and Personal history of radiation therapy (2008).   ? ?PAST SURGICAL HISTORY: ?Past Surgical History:  ?Procedure Laterality Date  ? AUGMENTATION MAMMAPLASTY Bilateral 2003  ? BILATERAL SALPINGOOPHORECTOMY  6/10  ? l  ? BREAST BIOPSY  2/07  ? breast excisional biopsy/chemo  ? BREAST BIOPSY  1/14 or 2/14  ? right-benign  ? BREAST ENHANCEMENT SURGERY  2003  ? saline  ? BREAST LUMPECTOMY Left 2008  ? REDUCTION MAMMAPLASTY Bilateral 2009  ? WISDOM TOOTH EXTRACTION    ? ? ?FAMILY HISTORY: family history includes  Alcohol abuse in her mother; Alzheimer's disease in her father; Anemia in her mother; Heart disease in her maternal grandmother and paternal grandmother; Hypertension in her maternal grandmother and paternal grandmother; Osteoporosis in her mother. ? ?SOCIAL  HISTORY:  reports that she has never smoked. She has never used smokeless tobacco. She reports current alcohol use. She reports that she does not use drugs. ? ?ALLERGIES: Dayquil [pseudoephedrine-apap-dm] and Lisinopril ? ?MEDICATIONS:  ?Current Outpatient Medications  ?Medication Sig Dispense Refill  ? acamprosate (CAMPRAL) 333 MG tablet TK 1 T PO TID (Patient not taking: Reported on 09/23/2021)    ? Ascorbic Acid (VITAMIN C) 1000 MG tablet Take 1,000 mg by mouth daily.    ? atorvastatin (LIPITOR) 40 MG tablet Take 40 mg by mouth daily.    ? Cholecalciferol (VITAMIN D3) 1.25 MG (50000 UT) CAPS Take by mouth.    ? escitalopram (LEXAPRO) 10 MG tablet 10 mg daily.    ? hydrochlorothiazide (HYDRODIURIL) 25 MG tablet Take 25 mg by mouth daily.     ? Multiple Vitamins-Minerals (MULTIVITAMIN PO) Take by mouth daily. (Patient not taking: Reported on 09/23/2021)    ? traZODone (DESYREL) 50 MG tablet Take 1 tablet (50 mg total) by mouth at bedtime as needed. 90 tablet 4  ? zinc gluconate 50 MG tablet Take 50 mg by mouth daily.    ? ?No current facility-administered medications for this encounter.  ? ? ?REVIEW OF SYSTEMS: As above in HPI. ?  ?PHYSICAL EXAM:  vitals were not taken for this visit.   ?General: Alert and oriented, in no acute distress ?HEENT: Head is normocephalic. Extraocular movements are intact.   ?Heart: Regular in rate and rhythm. Soft systolic aortic murmur. ?Chest: Clear to auscultation bilaterally, with no rhonchi, wheezes, or rales. ?Abdomen: Soft, nontender, nondistended, with no rigidity or guarding. ?Extremities: No cyanosis or edema. ?Skin: No concerning lesions. ?Musculoskeletal: symmetric strength and muscle tone throughout. ?Neurologic: Cranial nerves II through XII are grossly intact. No obvious focalities. Speech is fluent. Coordination is intact. ?Psychiatric: Judgment and insight are intact. Affect is appropriate. ?Breasts: right breast notable for a mass in the UOQ, approx 1cm-1.5cm in dimension  . No other palpable masses appreciated in the breasts or axillae bilaterally ? ?ECOG = 0 ? ?0 - Asymptomatic (Fully active, able to carry on all predisease activities without restriction) ? ?1 - Symptomatic but completely ambulatory (Restricted in physically strenuous activity but ambulatory and able to carry out work of a light or sedentary nature. For example, light housework, office work) ? ?2 - Symptomatic, <50% in bed during the day (Ambulatory and capable of all self care but unable to carry out any work activities. Up and about more than 50% of waking hours) ? ?3 - Symptomatic, >50% in bed, but not bedbound (Capable of only limited self-care, confined to bed or chair 50% or more of waking hours) ? ?4 - Bedbound (Completely disabled. Cannot carry on any self-care. Totally confined to bed or chair) ? ?5 - Death ? ? Oken MM, Creech RH, Tormey DC, et al. [phone]). "Toxicity and response criteria of the [facility]". [place] Oncol. 5 (6): 649-55 ? ? ?LABORATORY DATA:  ?Lab Results  ?Component Value Date  ? WBC 6.1 12/08/2021  ? HGB 13.3 12/08/2021  ? HCT 39.0 12/08/2021  ? MCV 89.4 12/08/2021  ? PLT 269 12/08/2021  ? ?CMP  ?   ?Component Value Date/Time  ? NA 141 12/08/2021 1212  ? NA 142 07/24/2014  1122  ? K 3.4 (L) 12/08/2021 1212  ? K 3.9 07/24/2014 1122  ? CL 106 12/08/2021 1212  ? CL 102 12/26/2012 1613  ? CO2 28 12/08/2021 1212  ? CO2 27 07/24/2014 1122  ? GLUCOSE 134 (H) 12/08/2021 1212  ? GLUCOSE 99 07/24/2014 1122  ? GLUCOSE 100 (H) 12/26/2012 1613  ? BUN 14 12/08/2021 1212  ? BUN 14.7 07/24/2014 1122  ? CREATININE 0.71 12/08/2021 1212  ? CREATININE 0.8 07/24/2014 1122  ? CALCIUM 10.1 12/08/2021 1212  ? CALCIUM 10.3 07/24/2014 1122  ? PROT 7.1 12/08/2021 1212  ? PROT 7.1 07/24/2014 1122  ? ALBUMIN 4.1 12/08/2021 1212  ? ALBUMIN 4.0 07/24/2014 1122  ? AST 14 (L) 12/08/2021 1212  ? AST 19 07/24/2014 1122  ? ALT 15 12/08/2021 1212  ? ALT 19 07/24/2014 1122  ? ALKPHOS 121 12/08/2021  1212  ? ALKPHOS 113 07/24/2014 1122  ? BILITOT 0.5 12/08/2021 1212  ? BILITOT 0.39 07/24/2014 1122  ? GFRNONAA >60 12/08/2021 1212  ? GFRAA  02/16/2009 0604  ?  >60        ?The eGFR has been calculated ?usi

## 2021-12-08 ENCOUNTER — Encounter: Payer: Self-pay | Admitting: General Practice

## 2021-12-08 ENCOUNTER — Inpatient Hospital Stay: Payer: Managed Care, Other (non HMO)

## 2021-12-08 ENCOUNTER — Inpatient Hospital Stay (HOSPITAL_BASED_OUTPATIENT_CLINIC_OR_DEPARTMENT_OTHER): Payer: Managed Care, Other (non HMO) | Admitting: Genetic Counselor

## 2021-12-08 ENCOUNTER — Other Ambulatory Visit: Payer: Self-pay

## 2021-12-08 ENCOUNTER — Ambulatory Visit: Payer: Managed Care, Other (non HMO) | Admitting: Physical Therapy

## 2021-12-08 ENCOUNTER — Encounter: Payer: Self-pay | Admitting: Radiation Oncology

## 2021-12-08 ENCOUNTER — Encounter: Payer: Self-pay | Admitting: Physical Therapy

## 2021-12-08 ENCOUNTER — Inpatient Hospital Stay: Payer: Managed Care, Other (non HMO) | Attending: Hematology and Oncology | Admitting: Hematology and Oncology

## 2021-12-08 ENCOUNTER — Other Ambulatory Visit: Payer: Self-pay | Admitting: Surgery

## 2021-12-08 ENCOUNTER — Ambulatory Visit
Admission: RE | Admit: 2021-12-08 | Discharge: 2021-12-08 | Disposition: A | Discharge status: Home / Self Care | Payer: Managed Care, Other (non HMO) | Source: Ambulatory Visit | Attending: Radiation Oncology | Admitting: Radiation Oncology

## 2021-12-08 ENCOUNTER — Encounter: Payer: Self-pay | Admitting: Hematology and Oncology

## 2021-12-08 ENCOUNTER — Ambulatory Visit: Payer: Managed Care, Other (non HMO) | Attending: Surgery | Admitting: Physical Therapy

## 2021-12-08 DIAGNOSIS — Z9221 Personal history of antineoplastic chemotherapy: Secondary | ICD-10-CM | POA: Diagnosis not present

## 2021-12-08 DIAGNOSIS — R293 Abnormal posture: Secondary | ICD-10-CM | POA: Diagnosis not present

## 2021-12-08 DIAGNOSIS — Z170 Estrogen receptor positive status [ER+]: Secondary | ICD-10-CM

## 2021-12-08 DIAGNOSIS — N6489 Other specified disorders of breast: Secondary | ICD-10-CM

## 2021-12-08 DIAGNOSIS — Z79899 Other long term (current) drug therapy: Secondary | ICD-10-CM | POA: Insufficient documentation

## 2021-12-08 DIAGNOSIS — Z923 Personal history of irradiation: Secondary | ICD-10-CM | POA: Insufficient documentation

## 2021-12-08 DIAGNOSIS — Z853 Personal history of malignant neoplasm of breast: Secondary | ICD-10-CM | POA: Insufficient documentation

## 2021-12-08 DIAGNOSIS — C50411 Malignant neoplasm of upper-outer quadrant of right female breast: Secondary | ICD-10-CM | POA: Insufficient documentation

## 2021-12-08 LAB — CBC WITH DIFFERENTIAL (CANCER CENTER ONLY)
Abs Immature Granulocytes: 0.02 10*3/uL (ref 0.00–0.07)
Basophils Absolute: 0 10*3/uL (ref 0.0–0.1)
Basophils Relative: 1 %
Eosinophils Absolute: 0.2 10*3/uL (ref 0.0–0.5)
Eosinophils Relative: 3 %
HCT: 39 % (ref 36.0–46.0)
Hemoglobin: 13.3 g/dL (ref 12.0–15.0)
Immature Granulocytes: 0 %
Lymphocytes Relative: 30 %
Lymphs Abs: 1.8 10*3/uL (ref 0.7–4.0)
MCH: 30.5 pg (ref 26.0–34.0)
MCHC: 34.1 g/dL (ref 30.0–36.0)
MCV: 89.4 fL (ref 80.0–100.0)
Monocytes Absolute: 0.5 10*3/uL (ref 0.1–1.0)
Monocytes Relative: 9 %
Neutro Abs: 3.5 10*3/uL (ref 1.7–7.7)
Neutrophils Relative %: 57 %
Platelet Count: 269 10*3/uL (ref 150–400)
RBC: 4.36 MIL/uL (ref 3.87–5.11)
RDW: 13.2 % (ref 11.5–15.5)
WBC Count: 6.1 10*3/uL (ref 4.0–10.5)
nRBC: 0 % (ref 0.0–0.2)

## 2021-12-08 LAB — CMP (CANCER CENTER ONLY)
ALT: 15 U/L (ref 0–44)
AST: 14 U/L — ABNORMAL LOW (ref 15–41)
Albumin: 4.1 g/dL (ref 3.5–5.0)
Alkaline Phosphatase: 121 U/L (ref 38–126)
Anion gap: 7 (ref 5–15)
BUN: 14 mg/dL (ref 8–23)
CO2: 28 mmol/L (ref 22–32)
Calcium: 10.1 mg/dL (ref 8.9–10.3)
Chloride: 106 mmol/L (ref 98–111)
Creatinine: 0.71 mg/dL (ref 0.44–1.00)
GFR, Estimated: >60 mL/min (ref >60)
Glucose, Bld: 134 mg/dL — ABNORMAL HIGH (ref 70–99)
Potassium: 3.4 mmol/L — ABNORMAL LOW (ref 3.5–5.1)
Sodium: 141 mmol/L (ref 135–145)
Total Bilirubin: 0.5 mg/dL (ref 0.3–1.2)
Total Protein: 7.1 g/dL (ref 6.5–8.1)

## 2021-12-08 LAB — GENETIC SCREENING ORDER

## 2021-12-08 NOTE — Therapy (Signed)
?OUTPATIENT PHYSICAL THERAPY BREAST CANCER BASELINE EVALUATION ? ? ?Patient Name: Marilyn Hansen ?MRN: [id number] ?DOB:07-Feb-1956, 66 y.o., female ?Today's Date: 12/08/2021 ? ? PT End of Session - 12/08/21 1636   ? ? Visit Number 1   First visit for breast cancer  ? Number of Visits 2   ? Date for PT Re-Evaluation 02/02/22   ? PT Start Time 8413   ? PT Stop Time 2440   ? PT Time Calculation (min) 30 min   ? Activity Tolerance Patient tolerated treatment well   ? Behavior During Therapy [facility] for tasks assessed/performed   ? ?  ?  ? ?  ? ? ?Past Medical History:  ?Diagnosis Date  ? Breast cancer ([place]) 08/2006  ? invasive breast cancer (T2, N1 ER+/PR+) BRCA 1/11 negative  ? H/O left breast biopsy 09/2005  ? intraductal papilloma with atypical ductal hyperplasia  ? Hyperlipemia   ? Hypertension   ? Personal history of chemotherapy 2008  ? Personal history of radiation therapy 2008  ? ?Past Surgical History:  ?Procedure Laterality Date  ? AUGMENTATION MAMMAPLASTY Bilateral 2003  ? BILATERAL SALPINGOOPHORECTOMY  6/10  ? l  ? BREAST BIOPSY  2/07  ? breast excisional biopsy/chemo  ? BREAST BIOPSY  1/14 or 2/14  ? right-benign  ? BREAST ENHANCEMENT SURGERY  2003  ? saline  ? BREAST LUMPECTOMY Left 2008  ? REDUCTION MAMMAPLASTY Bilateral 2009  ? WISDOM TOOTH EXTRACTION    ? ?Patient Active Problem List  ? Diagnosis Date Noted  ? Malignant neoplasm of upper-outer quadrant of right breast in female, estrogen receptor positive ([place]) 12/03/2021  ? Fatty liver 09/05/2016  ? Essential hypertension 09/05/2016  ? Insomnia 09/05/2016  ? Anxiety state 09/05/2016  ? Breast cancer of upper-outer quadrant of left female breast ([place]) 01/16/2014  ? ? ?PCP: Kathyrn Lass, MD ? ?REFERRING PROVIDER: [name], MD ? ?REFERRING DIAG: Right breast cancer ? ?THERAPY DIAG:  ?Malignant neoplasm of upper-outer quadrant of right breast in female, estrogen receptor positive ([place]) ? ?Abnormal posture ? ?ONSET DATE: 10/19/2021 ? ?SUBJECTIVE                                                                                                                                                                                           ? ?SUBJECTIVE STATEMENT: ?Patient reports she is here today to be seen by her medical team for her newly diagnosed right breast cancer.  ? ?PERTINENT HISTORY:  ?Patient was diagnosed on 10/14/2021 with right grade 2 invasive ductal carcinoma breast cancer. It measures 1 cm and is located in the upper outer quadrant. It is ER/PR positive  and HER2 negative with a Ki67 of 10%. She has a history of a left lumpectomy and axillary lymph node dissection in 2008 followed by chemotherapy and radiation. She believes she had 16 axillary nodes removed. ? ?PATIENT GOALS   reduce lymphedema risk and learn post op HEP.  ? ?PAIN:  ?Are you having pain? No ? ? ?PRECAUTIONS: Active CA Other: Left arm lymphedema risk from 2008 ? ?HAND DOMINANCE: right ? ?WEIGHT BEARING RESTRICTIONS No ? ?FALLS:  ?Has patient fallen in last 6 months? No ? ?LIVING ENVIRONMENT: ?Patient lives with: her husband ?Lives in: House/apartment ?Has following equipment at home: None ? ?OCCUPATION: Retired ? ?LEISURE: She does not exercise ? ?PRIOR LEVEL OF FUNCTION: Independent ? ? ?OBJECTIVE ? ?COGNITION: ? Overall cognitive status: Within functional limits for tasks assessed   ? ?POSTURE:  ?Forward head and rounded shoulders posture ? ?UPPER EXTREMITY AROM/PROM: ? ?A/PROM RIGHT  12/08/2021 ?  ?Shoulder extension 45  ?Shoulder flexion 149  ?Shoulder abduction 159  ?Shoulder internal rotation 80  ?Shoulder external rotation 86  ?  (Blank rows = not tested) ? ?A/PROM LEFT  12/08/2021  ?Shoulder extension 50  ?Shoulder flexion 146  ?Shoulder abduction 164  ?Shoulder internal rotation 64  ?Shoulder external rotation 90  ?  (Blank rows = not tested) ? ? ?CERVICAL AROM: ?All within normal limits ? ?UPPER EXTREMITY STRENGTH: WNL ? ? ?LYMPHEDEMA ASSESSMENTS:  ? ?[place] RIGHT  12/08/2021  ?10 cm  proximal to olecranon process 28.1  ?Olecranon process 23.4  ?10 cm proximal to ulnar styloid process 21.2  ?Just proximal to ulnar styloid process 15.5  ?Across hand at thumb web space 18.5  ?At base of 2nd digit 6.3  ?(Blank rows = not tested) ? ?[place] LEFT  12/08/2021  ?10 cm proximal to olecranon process 28.4  ?Olecranon process 23.4  ?10 cm proximal to ulnar styloid process 20  ?Just proximal to ulnar styloid process 15.3  ?Across hand at thumb web space 18.3  ?At base of 2nd digit 6  ?(Blank rows = not tested) ? ? ?L-DEX LYMPHEDEMA SCREENING: ? ?The patient was assessed using the L-Dex machine today to produce a lymphedema index baseline score. The patient will be reassessed on a regular basis (typically every 3 months) to obtain new L-Dex scores. If the score is > 6.5 points away from his/her baseline score indicating onset of subclinical lymphedema, it will be recommended to wear a compression garment for 4 weeks, 12 hours per day and then be reassessed. If the score continues to be > 6.5 points from baseline at reassessment, we will initiate lymphedema treatment. Assessing in this manner has a 95% rate of preventing clinically significant lymphedema. ? ? L-DEX FLOWSHEETS - 12/08/21 1600   ? ?  ? L-DEX LYMPHEDEMA SCREENING  ? Measurement Type Bilateral   ? L-DEX MEASUREMENT EXTREMITY Upper Extremity   ? POSITION  Standing   ? DOMINANT SIDE Right   ? BASELINE RIGHT -0.1   ? BASELINE LEFT -1.7   ? ?  ?  ? ?  ? ? ? ?QUICK DASH SURVEY: ? Katina Dung - 12/08/21 0001   ? ? Open a tight or new jar Moderate difficulty   ? Do heavy household chores (wash walls, wash floors) No difficulty   ? Carry a shopping bag or briefcase No difficulty   ? Wash your back No difficulty   ? Use a knife to cut food No difficulty   ? Recreational activities in which you take some  force or impact through your arm, shoulder, or hand (golf, hammering, tennis) No difficulty   ? During the past week, to what extent has your arm, shoulder  or hand problem interfered with your normal social activities with family, friends, neighbors, or groups? Not at all   ? During the past week, to what extent has your arm, shoulder or hand problem limited your work or other regular daily activities Not at all   ? Arm, shoulder, or hand pain. None   ? Tingling (pins and needles) in your arm, shoulder, or hand None   ? Difficulty Sleeping No difficulty   ? DASH Score 4.55 %   ? ?  ?  ? ?  ? ? ? ?PATIENT EDUCATION:  ?Education details: Lymphedema risk reduction and post op shoulder/posture HEP ?Person educated: Patient ?Education method: Explanation, Demonstration, Handout ?Education comprehension: Patient verbalized understanding and returned demonstration ? ? ?HOME EXERCISE PROGRAM: ?Patient was instructed today in a home exercise program today for post op shoulder range of motion. These included active assist shoulder flexion in sitting, scapular retraction, wall walking with shoulder abduction, and hands behind head external rotation.  She was encouraged to do these twice a day, holding 3 seconds and repeating 5 times when permitted by her physician. ? ? ?ASSESSMENT: ? ?CLINICAL IMPRESSION: ?Patient was diagnosed on 10/14/2021 with right grade 2 invasive ductal carcinoma breast cancer. It measures 1 cm and is located in the upper outer quadrant. It is ER/PR positive and HER2 negative with a Ki67 of 10%. She has a history of a left lumpectomy and axillary lymph node dissection in 2008 followed by chemotherapy and radiation. She believes she had 16 axillary nodes removed.Her multidisciplinary medical team met prior to her assessments to determine a recommended treatment plan. She is planning to have a right lumpectomy and sentinel node biopsy followed by Oncotype testing, radiation, and anti-estrogen therapy. She will benefit from a post op PT reassessment to determine needs and from L-Dex screens every 3 months for 2 years to detect subclinical lymphedema. ? ?Pt will  benefit from skilled therapeutic intervention to improve on the following deficits: Decreased knowledge of precautions, impaired UE functional use, pain, decreased ROM, postural dysfunction.  ? ?PT treatm

## 2021-12-08 NOTE — Progress Notes (Signed)
[facility] Psychosocial Distress Screening ?Spiritual Care ? ?Met with Marilyn Hansen and her husband Marilyn Hansen in [facility] to introduce [facility] team/resources, reviewing distress screen per protocol.  The patient scored a 5 on the Psychosocial Distress Thermometer which indicates moderate distress. Also assessed for distress and other psychosocial needs.  ? ? 12/08/2021  ?ONCBCN DISTRESS SCREENING   ?Screening Type Initial Screening   ?Distress experienced in past week (1-10) 5   ?Emotional problem type Nervousness/Anxiety   ?Physical Problem type Constipation/diarrhea   ?Referral to support programs Yes   ? ?[name] reports good church support and a strong spiritual life, both of which are helping her cope and make meaning of her experience. She reports that her biggest stressor has been the delay between initial mammogram and actual diagnosis, a five-week period that left her in significant distress. Now that she has attended [facility], meeting team and learning scope of diagnosis and treatment, she reports feeling considerably relieved. ? ?Follow up needed: No. [name] plans to reach out as needed/desired for follow-up support. ? ? ?Chaplain Lorrin Jackson, MDiv, [facility] ?Pager [phone] ?Voicemail [phone] ? ? ? ? ?  ?

## 2021-12-08 NOTE — Assessment & Plan Note (Signed)
Marilyn Hansen is a pleasant 66 y.o. female with history of Stage IIA left breast invasive ductal carcinoma, ER+/PR+/HER2-, diagnosed in 09/2006, treated with lumpectomy, adjuvant chemotherapy, adjuvant radiation therapy, and anti-estrogen therapy with Tamoxifen, followed by Letrozole completed in 06/2018.  She was last seen in survivorship clinic by [facility].  She presents here for new diagnosis of right breast invasive ductal carcinoma. ? ?Given small tumor with favorable prognostics we have discussed about upfront surgery followed by Oncotype testing.  We have discussed the following details about Oncotype ?We have discussed about Oncotype Dx score which is a well validated prognostic scoring system which can predict outcome with endocrine therapy alone and whether chemotherapy reduces recurrence.  Typically in patients with ER positive cancers that are node negative if the RS score is high typically greater than or equal to 26, chemotherapy is recommended.  ?In women with intermediate recurrence score younger than 49, there can still be some role for chemotherapy in addition to endocrine therapy especially if the recurrence score is between 21-25. ?If chemotherapy is needed, this will precede radiation and then after radiation she will continue on antiestrogen therapy. ? ?If she does not have to proceed with adjuvant chemotherapy, she will continue adjuvant radiation followed by antiestrogen therapy.  She has tolerated tamoxifen well in the past as well as letrozole.  She understands that she could proceed with either one of them for antiestrogen therapy.  She may need another bone density scan for screening for osteoporosis. ? ?She had BRCA1 and BRCA2 testing, will benefit from expanded panel.  She will return to clinic after surgery. ?

## 2021-12-08 NOTE — Research (Signed)
[employer] 2021-05 - Specimen Collection Study to Evaluate Biomarkers in Subjects with Cancer  ? ?Patient Marilyn Hansen was identified by Dr Chryl Heck as a potential candidate for the above listed study.  This Clinical Research Nurse met with Marilyn Hansen, [id number], on 12/08/21 in a manner and location that ensures patient privacy to discuss participation in the above listed research study.  Patient is Accompanied by her husband .  A copy of the informed consent document with embedded HIPAA language was provided to the patient.  Patient reads, speaks, and understands [place].   ?Patient was provided with the business card of this Nurse and encouraged to contact the research team with any questions.  Approximately 15 minutes were spent with the patient reviewing the informed consent documents.  Patient was provided the option of taking informed consent documents home to review and was encouraged to review at their convenience with their support network, including other care providers. Patient took the consent documents home to review. ? ?Research will follow-up with patient on Monday, 4/24, to discuss questions and interest in participation. Patient provided with direct contact information for needs in the meantime. ? ?Vickii Penna, RN, BSN, CPN ?Clinical Research Nurse I ?[phone] ? ?12/08/2021 3:32 PM ? ? ?

## 2021-12-08 NOTE — Progress Notes (Signed)
?SURVIVORSHIP VISIT: ? ? ? ?REASON FOR VISIT:  ?Routine follow-up for history of breast cancer.  ? ?BRIEF ONCOLOGIC HISTORY:  ?Oncology History  ?Breast cancer of upper-outer quadrant of left female breast ([place])  ?09/29/2006 Surgery  ? Left breast lumpectomy: Invasive ductal carcinoma 2.6 cm grade 21 SLN positive for micrometastatic disease 9 additional lymph nodes were negative, ER 99%, PR 90%, HER-2 1+, Ki-67 12% Oncotype DX intermediate risk ? ?  ? Procedure  ? BRCA1 and 2 testing was done and it was negative ? ?  ?11/30/2006 - 04/01/2007 Chemotherapy  ? Taxotere Cytoxan x4 ? ?  ? Radiation Therapy  ? Adjuvant radiation therapy ? ?  ?09/01/2007 - 07/13/2018 Anti-estrogen oral therapy  ? Tamoxifen from 2009 to 2011 then switched to Letrozole 2.5 mg daily  ?  ?Malignant neoplasm of upper-outer quadrant of right breast in female, estrogen receptor positive ([place])  ?10/19/2021 Mammogram  ? Mammogram from February 2023 showed a possible mass as well as separate area of possible distortion in the right breast.  No suspicious findings in the left breast.  Right breast mammogram showed possible subtle distortion within the slightly outer right breast.  Suspicious 1 cm upper outer right breast mass.  Ultrasound-guided biopsy is recommended.  No abnormal appearing right axillary lymph nodes. ?  ?11/29/2021 Pathology Results  ? Pathology showed invasive ductal carcinoma, grade 2 along with DCIS in the right breast needle core biopsy at 11 o'clock position.  Biopsy from the right breast upper outer quadrant showed complex sclerosing lesion with usual ductal hyperplasia.  Fibrocystic changes with usual ductal hyperplasia.  Prognostics from the tumor show ER 100% positive strong staining intensity, PR 100% positive, strong staining intensity, Ki-67 of 10% and HER2 equivocal, preliminary FISH was reported negative ?  ?12/03/2021 Initial Diagnosis  ? Malignant neoplasm of upper-outer quadrant of right breast in female, estrogen receptor  positive ([place]) ?  ? ? ? ?INTERVAL HISTORY:  ? ?Ms. Myalynn is a very pleasant 66 year old female patient with past medical history significant for left breast invasive ductal carcinoma status post left breast lumpectomy followed by adjuvant chemotherapy, adjuvant radiation but took antiestrogen therapy for almost 10 years now diagnosed with right breast invasive ductal carcinoma and hence referred to breast [place].  She denies any palpable masses prior to this abnormal mammogram.  Rest of the pertinent 10 point ROS reviewed and negative ? ? ?REVIEW OF SYSTEMS:  ?Review of Systems  ?Constitutional:  Negative for appetite change, chills, fatigue, fever and unexpected weight change.  ?HENT:   Negative for hearing loss, lump/mass, sore throat and trouble swallowing.   ?Eyes:  Negative for eye problems and icterus.  ?Respiratory:  Negative for chest tightness, cough and shortness of breath.   ?Cardiovascular:  Negative for chest pain, leg swelling and palpitations.  ?Gastrointestinal:  Negative for abdominal distention, abdominal pain, constipation, diarrhea, nausea and vomiting.  ?Endocrine: Negative for hot flashes.  ?Genitourinary:  Negative for difficulty urinating.   ?Musculoskeletal:  Negative for arthralgias.  ?Skin:  Negative for itching and rash.  ?Neurological:  Negative for dizziness, extremity weakness, headaches and numbness.  ?Hematological:  Negative for adenopathy. Does not bruise/bleed easily.  ?Psychiatric/Behavioral:  Negative for depression. The patient is not nervous/anxious.  Breast: Denies any new nodularity, masses, tenderness, nipple changes, or nipple discharge.  ? ? ? ? ? ?PAST MEDICAL/SURGICAL HISTORY:  ?Past Medical History:  ?Diagnosis Date  ? Breast cancer ([place]) 08/2006  ? invasive breast cancer (T2, N1 ER+/PR+) BRCA 1/11 negative  ?  H/O left breast biopsy 09/2005  ? intraductal papilloma with atypical ductal hyperplasia  ? Hyperlipemia   ? Hypertension   ? Personal history of chemotherapy 2008   ? Personal history of radiation therapy 2008  ? ?Past Surgical History:  ?Procedure Laterality Date  ? AUGMENTATION MAMMAPLASTY Bilateral 2003  ? BILATERAL SALPINGOOPHORECTOMY  6/10  ? l  ? BREAST BIOPSY  2/07  ? breast excisional biopsy/chemo  ? BREAST BIOPSY  1/14 or 2/14  ? right-benign  ? BREAST ENHANCEMENT SURGERY  2003  ? saline  ? BREAST LUMPECTOMY Left 2008  ? REDUCTION MAMMAPLASTY Bilateral 2009  ? WISDOM TOOTH EXTRACTION    ? ? ? ?ALLERGIES:  ?Allergies  ?Allergen Reactions  ? Dayquil [Pseudoephedrine-Apap-Dm] Hives  ? Lisinopril Cough  ? ? ? ?CURRENT MEDICATIONS:  ?Outpatient Encounter Medications as of 12/08/2021  ?Medication Sig Note  ? acamprosate (CAMPRAL) 333 MG tablet TK 1 T PO TID (Patient not taking: Reported on 09/23/2021)   ? Ascorbic Acid (VITAMIN C) 1000 MG tablet Take 1,000 mg by mouth daily.   ? atorvastatin (LIPITOR) 40 MG tablet Take 40 mg by mouth daily.   ? Cholecalciferol (VITAMIN D3) 1.25 MG (50000 UT) CAPS Take by mouth.   ? escitalopram (LEXAPRO) 10 MG tablet 10 mg daily.   ? hydrochlorothiazide (HYDRODIURIL) 25 MG tablet Take 25 mg by mouth daily.  09/05/2016: Received from: External Pharmacy  ? Multiple Vitamins-Minerals (MULTIVITAMIN PO) Take by mouth daily. (Patient not taking: Reported on 09/23/2021)   ? traZODone (DESYREL) 50 MG tablet Take 1 tablet (50 mg total) by mouth at bedtime as needed.   ? zinc gluconate 50 MG tablet Take 50 mg by mouth daily.   ? ?No facility-administered encounter medications on file as of 12/08/2021.  ? ? ? ?ONCOLOGIC FAMILY HISTORY:  ?Family History  ?Problem Relation Age of Onset  ? Anemia Mother   ? Osteoporosis Mother   ? Alcohol abuse Mother   ? Alzheimer's disease Father   ? Hypertension Maternal Grandmother   ? Heart disease Maternal Grandmother   ? Hypertension Paternal Grandmother   ? Heart disease Paternal Grandmother   ? ? ?GENETIC COUNSELING/TESTING: ?negative ? ?SOCIAL HISTORY:  ?Social History  ? ?Socioeconomic History  ? Marital status:  Married  ?  Spouse name: Not on file  ? Number of children: Not on file  ? Years of education: Not on file  ? Highest education level: Not on file  ?Occupational History  ? Not on file  ?Tobacco Use  ? Smoking status: Never  ? Smokeless tobacco: Never  ?Vaping Use  ? Vaping Use: Never used  ?Substance and Sexual Activity  ? Alcohol use: Yes  ?  Comment: 10  ? Drug use: No  ? Sexual activity: Not Currently  ?  Partners: Male  ?  Birth control/protection: Post-menopausal  ?Other Topics Concern  ? Not on file  ?Social History Narrative  ? Not on file  ? ?Social Determinants of Health  ? ?Financial Resource Strain: Not on file  ?Food Insecurity: Not on file  ?Transportation Needs: Not on file  ?Physical Activity: Not on file  ?Stress: Not on file  ?Social Connections: Not on file  ?Intimate Partner Violence: Not on file  ?  ? ? ?OBJECTIVE:  ?BP (!) 145/85 (BP Location: Left Arm, Patient Position: Sitting)   Pulse 73   Temp 97.7 ?F (36.5 ?C) (Temporal)   Resp 18   Ht '5\' 4"'  (1.626 m)   Wt 188  lb 12.8 oz (85.6 kg)   LMP 11/21/2006   SpO2 99%   BMI 32.41 kg/m?  ? ?Physical Exam ?Constitutional:   ?   Appearance: Normal appearance.  ?Cardiovascular:  ?   Rate and Rhythm: Normal rate and regular rhythm.  ?   Pulses: Normal pulses.  ?   Heart sounds: Normal heart sounds.  ?Pulmonary:  ?   Effort: Pulmonary effort is normal.  ?   Breath sounds: Normal breath sounds.  ?Chest:  ?   Comments: Bilateral breasts inspected.  Left breast status postlumpectomy, smaller than the right breast.  No palpable mass in the left breast or regional adenopathy.  Right breast palpable density at the area of biopsy measuring about a centimeter at that time to 11 o'clock position.  No regional adenopathy palpated in the right axilla ?Abdominal:  ?   General: Abdomen is flat. Bowel sounds are normal.  ?   Palpations: Abdomen is soft.  ?Musculoskeletal:  ?   Cervical back: Normal range of motion and neck supple. No rigidity.   ?Lymphadenopathy:  ?   Cervical: No cervical adenopathy.  ?Neurological:  ?   General: No focal deficit present.  ?   Mental Status: She is alert.  ?Psychiatric:     ?   Mood and Affect: Mood normal.  ? ?LABORATORY DATA:  ?None

## 2021-12-09 ENCOUNTER — Encounter: Payer: Self-pay | Admitting: *Deleted

## 2021-12-09 ENCOUNTER — Other Ambulatory Visit: Payer: Self-pay | Admitting: Surgery

## 2021-12-09 ENCOUNTER — Telehealth: Payer: Self-pay | Admitting: *Deleted

## 2021-12-09 ENCOUNTER — Encounter: Payer: Self-pay | Admitting: Genetic Counselor

## 2021-12-09 DIAGNOSIS — N6489 Other specified disorders of breast: Secondary | ICD-10-CM

## 2021-12-09 DIAGNOSIS — Z853 Personal history of malignant neoplasm of breast: Secondary | ICD-10-CM

## 2021-12-09 HISTORY — DX: Personal history of malignant neoplasm of breast: Z85.3

## 2021-12-09 NOTE — Telephone Encounter (Signed)
Spoke to pt concerning [place] from 12/09/21. Denies questions or concerns regarding dx or treatment care plan. Pt chose to have sx after vacation in May. ?Encourage pt to call with needs. Received verbal understanding. ?

## 2021-12-09 NOTE — Progress Notes (Signed)
REFERRING PROVIDER: ?Benay Pike, MD ?[employer] ?[place],  [place] [address] ? ?PRIMARY PROVIDER:  ?Kathyrn Lass, MD ? ?PRIMARY REASON FOR VISIT:  ?1. Malignant neoplasm of upper-outer quadrant of right breast in female, estrogen receptor positive ([place])   ?2. Personal history of breast cancer   ? ? ?HISTORY OF PRESENT ILLNESS:   ?Marilyn Hansen, a 66 y.o. female, was seen for a [facility] cancer genetics consultation during the breast multidisciplinary clinic at the request of Dr. Chryl Heck due to a personal history of cancer.  [name] presents to clinic today to discuss the possibility of a hereditary predisposition to cancer, to discuss genetic testing, and to further clarify her future cancer risks, as well as potential cancer risks for family members.  ? ?In 2008, at the age of 40, 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 In 2008, she had negative genetic testing for BRCA1 and BRCA2 only through [facility].  No report was available for review today.  ? ?In 2023, at the age of 38, [name] was diagnosed with invasive ductal carcinoma of the right breast (ER+/PR+/HER2-).  The treatment plan is pending.   ? ?CANCER HISTORY:  ?Oncology History  ?Breast cancer of upper-outer quadrant of left female breast ([place])  ?09/29/2006 Surgery  ? Left breast lumpectomy: Invasive ductal carcinoma 2.6 cm grade 21 SLN positive for micrometastatic disease 9 additional lymph nodes were negative, ER 99%, PR 90%, HER-2 1+, Ki-67 12% Oncotype DX intermediate risk ? ?  ? Procedure  ? BRCA1 and 2 testing was done and it was negative ? ?  ?11/30/2006 - 04/01/2007 Chemotherapy  ? Taxotere Cytoxan x4 ? ?  ? Radiation Therapy  ? Adjuvant radiation therapy ? ?  ?09/01/2007 - 07/13/2018 Anti-estrogen oral therapy  ? Tamoxifen from 2009 to 2011 then switched to Letrozole 2.5 mg daily  ?  ?Malignant neoplasm of upper-outer quadrant of right breast in female, estrogen receptor positive ([place])  ?10/19/2021  Mammogram  ? Mammogram from February 2023 showed a possible mass as well as separate area of possible distortion in the right breast.  No suspicious findings in the left breast.  Right breast mammogram showed possible subtle distortion within the slightly outer right breast.  Suspicious 1 cm upper outer right breast mass.  Ultrasound-guided biopsy is recommended.  No abnormal appearing right axillary lymph nodes. ?  ?11/29/2021 Pathology Results  ? Pathology showed invasive ductal carcinoma, grade 2 along with DCIS in the right breast needle core biopsy at 11 o'clock position.  Biopsy from the right breast upper outer quadrant showed complex sclerosing lesion with usual ductal hyperplasia.  Fibrocystic changes with usual ductal hyperplasia.  Prognostics from the tumor show ER 100% positive strong staining intensity, PR 100% positive, strong staining intensity, Ki-67 of 10% and HER2 equivocal, preliminary FISH was reported negative ?  ?12/03/2021 Initial Diagnosis  ? Malignant neoplasm of upper-outer quadrant of right breast in female, estrogen receptor positive ([place]) ?  ?12/08/2021 Cancer Staging  ? Staging form: Breast, AJCC 8th Edition ?- Clinical stage from 12/08/2021: Stage IA (cT1b, cN0, cM0, G2, ER+, PR+, HER2-) - Signed by Benay Pike, MD on 12/08/2021 ?Stage prefix: Initial diagnosis ?Histologic grading system: 3 grade system ? ?  ? ? ? ?RISK FACTORS:  ?Colonoscopy: yes;  most recent in 202 . ?Up to date with pelvic exams: most recent PAP in 2022. ?Menarche was at age 86.  ?Nulliparous. ?OCP use for approximately 0 years.  ?HRT use:  0 years. ? ? ?Past Medical History:  ?Diagnosis Date  ? Breast cancer ([place]) 08/2006  ? invasive breast cancer (T2, N1 ER+/PR+) BRCA 1/11 negative  ? H/O left breast biopsy 09/2005  ? intraductal papilloma with atypical ductal hyperplasia  ? Hyperlipemia   ? Hypertension   ? Personal history of breast cancer 12/09/2021  ? Personal history of chemotherapy 2008  ? Personal history of  radiation therapy 2008  ? ? ?Past Surgical History:  ?Procedure Laterality Date  ? AUGMENTATION MAMMAPLASTY Bilateral 2003  ? BILATERAL SALPINGOOPHORECTOMY  6/10  ? l  ? BREAST BIOPSY  2/07  ? breast excisional biopsy/chemo  ? BREAST BIOPSY  1/14 or 2/14  ? right-benign  ? BREAST ENHANCEMENT SURGERY  2003  ? saline  ? BREAST LUMPECTOMY Left 2008  ? REDUCTION MAMMAPLASTY Bilateral 2009  ? WISDOM TOOTH EXTRACTION    ? ? ? ?FAMILY HISTORY:  ?We obtained a detailed, 4-generation family history.  Significant diagnoses are listed below: ?Family History  ?Problem Relation Age of Onset  ? Throat cancer Maternal Aunt   ?     dx after 25  ? ? ?Ms. Delprado is unaware of previous family history of genetic testing for hereditary cancer risks. There is on reported Ashkenazi Jewish ancestry. There is no known consanguinity. ? ?GENETIC COUNSELING ASSESSMENT: Ms. Coiro is a 66 y.o. female with a personal history of cancer which is somewhat suggestive of a hereditary cancer syndrome and predisposition to cancer given her history of multiple breast primaries and her age of first breast cancer diagnosis. We, therefore, discussed and recommended the following at today's visit.  ? ?DISCUSSION: We discussed that 5 - 10% of cancer is hereditary, with most cases of hereditary breast cancer associated with mutations in BRCA1/2.  There are other genes that can be associated with hereditary breast cancer syndromes.  We discussed that testing is beneficial for several reasons including knowing how to follow individuals after completing their treatment, identifying whether potential treatment options would be beneficial, and understanding if other family members could be at risk for cancer and allowing them to undergo genetic testing.  ? ?We discussed the differences between the testing that was performed in 2008 and genetic testing offered today.  We discussed that in additions to the genes BRCA1/2 had tested previously, additional genes have  been found to be associated with breast cancer.  Examples of these genes include but are not limited to PALB2, CHEK2, and ATM.  Furthermore, we discussed that more comprehensive testing is available with rearrangement analysis and RNA analysis.  ? ?We reviewed the characteristics, features and inheritance patterns of hereditary cancer syndromes. We also discussed genetic testing, including the appropriate family members to test, the process of testing, insurance coverage and turn-around-time for results. We discussed the implications of a negative, positive and/or variant of uncertain significant result. In order to get genetic test results in a timely manner so that Ms. Frank can use these genetic test results for surgical decisions, we recommended Ms. Romas pursue genetic testing for the [employer].  The BRCAplus panel offered by [facility] and includes sequencing and deletion/duplication analysis for the following 8 genes: ATM, BRCA1, BRCA2, CDH1, CHEK2, PALB2, PTEN, and TP53. Once complete, we recommend Ms. Wolf pursue reflex genetic testing to a more comprehensive gene panel.  ? ?Ms. Haskins  was offered a common hereditary cancer panel (47 genes) and an expanded pan-cancer panel (77 genes). Ms. Zaragoza was informed of the benefits and limitations  of each panel, including that expanded pan-cancer panels contain genes that do not have clear management guidelines at this point in time.  We also discussed that as the number of genes included on a panel increases, the chances of variants of uncertain significance increases.  After considering the benefits and limitations of each gene panel, Ms. Bergeron  elected to have a common hereditary cancers panel through [facility]. ? ?The CustomNext-Cancer+RNAinsight panel offered by [facility] includes sequencing and rearrangement analysis for the following 47 genes:  APC, ATM, AXIN2, BARD1, BMPR1A, BRCA1, BRCA2, BRIP1, CDH1, CDK4, CDKN2A, CHEK2,  DICER1, EPCAM, GREM1, HOXB13, MEN1, MLH1, MSH2, MSH3, MSH6, MUTYH, NBN, NF1, NF2, NTHL1, PALB2, PMS2, POLD1, POLE, PTEN, RAD51C, RAD51D, RECQL, RET, SDHA, SDHAF2, SDHB, SDHC, SDHD, SMAD4, SMARCA4, STK11, TP53, TS

## 2021-12-10 ENCOUNTER — Other Ambulatory Visit: Payer: Self-pay

## 2021-12-13 ENCOUNTER — Telehealth: Payer: Self-pay

## 2021-12-13 ENCOUNTER — Telehealth: Payer: Self-pay | Admitting: Hematology and Oncology

## 2021-12-13 ENCOUNTER — Other Ambulatory Visit: Payer: Self-pay

## 2021-12-13 ENCOUNTER — Telehealth: Payer: Self-pay | Admitting: *Deleted

## 2021-12-13 DIAGNOSIS — Z8249 Family history of ischemic heart disease and other diseases of the circulatory system: Secondary | ICD-10-CM

## 2021-12-13 NOTE — Telephone Encounter (Signed)
[employer] 2021-05 - Specimen Collection Study to Evaluate Biomarkers in Subjects with Cancer  ? ?Called to follow-up with patient regarding interest in above-listed study. Patient confirmed she is interested and agreed to come to [facility] at 130pm tomorrow, 4/25, for consent and labs. ? ?Vickii Penna, RN, BSN, CPN ?Clinical Research Nurse I ?[phone] ? ?12/13/2021 1:36 PM ? ?

## 2021-12-13 NOTE — Telephone Encounter (Signed)
Scheduled appointment per inbasket message. Patient aware.   ?

## 2021-12-13 NOTE — Telephone Encounter (Signed)
CALLED PATIENT TO INFORM OF ECHO AND CT CARDIAC SCORING FOR 12-29-21- ARRIVAL TIME- 7:45 AM @ [facility] - ECHO TO BE DONE @ 8 AM THEN CT CARDIAC SCORING TO FOLLOW @ 9:30 AM @ [facility], NO RESTRICTIONS TO  TEST, PATIENT TOLD TO BRING $99.00 FOR CT CARDIAC, SPOKE WITH PATIENT AND SHE IS AWARE OF THESE TESTS ?

## 2021-12-14 ENCOUNTER — Inpatient Hospital Stay: Payer: Managed Care, Other (non HMO)

## 2021-12-14 DIAGNOSIS — C50411 Malignant neoplasm of upper-outer quadrant of right female breast: Secondary | ICD-10-CM

## 2021-12-14 LAB — RESEARCH LABS

## 2021-12-14 NOTE — Research (Addendum)
[employer] 2021-05 - Specimen Collection Study to Evaluate Biomarkers in Subjects with Cancer  ? ?CONSENT: Patient Marilyn Hansen was identified by Dr Chryl Heck as a potential candidate for the above listed study. This Clinical Research Nurse met with Marilyn Hansen, [id number] on 12/14/21 in a manner and location that ensures patient privacy to discuss participation in the above listed research study. Patient is Unaccompanied. Patient was previously provided with informed consent documents. Patient has not yet read the informed consent documents and so documents were reviewed page by page today. ? ?As outlined in the informed consent form, this Nurse and Marilyn Hansen discussed the purpose of the research study, the investigational nature of the study, study procedures and requirements for study participation, potential risks and benefits of study participation, as well as alternatives to participation. This study is not blinded or double-blinded. The patient understands participation is voluntary and they may withdraw from study participation at any time. This study does not involve randomization.  This study does not involve an investigational drug or device. This study does not involve a placebo. Patient understands enrollment is pending full eligibility review.  ? ?Confidentiality and how the patient's information will be used as part of study participation were discussed. Patient was informed there is reimbursement provided for their time and effort spent on trial participation. The patient is encouraged to discuss research study participation with their insurance provider to determine what costs they may incur as part of study participation, including research related injury.   ? ?All questions were answered to patient's satisfaction. The informed consent with embedded HIPAA language was reviewed page by page. The patient's mental and emotional status is appropriate to provide informed consent, and the  patient verbalizes an understanding of study participation.  Patient has agreed to participate in the above listed research study and has voluntarily signed the informed consent version 14 with embedded HIPAA language, version 14  on 12/14/21 at 1315PM. The patient was provided with a copy of the signed informed consent form with embedded HIPAA language for their reference. No study specific procedures were obtained prior to the signing of the informed consent document.  Approximately 25 minutes were spent with the patient reviewing the informed consent documents.  Patient was not requested to complete a Release of Information form. ? ? ?ELIGIBILITY: This Nurse has reviewed this patient's inclusion and exclusion criteria and confirmed Marilyn Hansen is eligible for study participation. Patient will continue with enrollment. ? ?Eligibility confirmed by treating investigator, who also agrees that patient should proceed with enrollment. ? ?SPECIMEN COLLECTION: Research blood specimen was collected using fresh venipuncture. Patient tolerated well. ? ?TISSUE REQUEST: Tissue request sent for block from patient's biopsy on 11/29/21. ?  ?GIFT CARD: Patient was given a $25 [employer] gift card for her participation in the study. ? ?DATA COLLECTION: ?Medical History:  ?High Blood Pressure  Yes ?Coronary Artery Disease No ?Lupus    No ?Rheumatoid Arthritis  No ?Diabetes   No      ?If yes, which type?      N/A ?Lynch Syndrome  No ? ?Is the patient currently taking a magnesium supplement? No ?If yes, dose and frequency? N/A ? ?Does the patient have a personal history of cancer (greater than 5 years ago)? Yes ?If yes, Cancer type and date of diagnosis?   Breast; 09/2006  ?Has this previous diagnosis been treated? Yes      ?If so, treatment type? Surgery, chemotherapy, radiation, anti-estrogen therapy   ?Start  and end dates of last treatment cycle? Surgery (09/2006), chemotherapy (11/2006 - 03/2007), radiation (2008-2009, and  anti-estrogen therapy (08/2007-06/2018) ? ?Does the patient have a family history of cancer in 1st or 2nd degree relatives? No ?If yes, Relationship(s) and Cancer type(s)? N/A ? ?Does the patient have history of alcohol consumption? Yes   ?If yes, current or former? Current ?If former, year stopped? N/A ?Number of years? Since 66 years old; 93 years ?Drinks per week? 4-5 ? ?Does the patient have history of cigarette, cigar, pipe, or chewing tobacco use?  No  ?If yes, current for former? N/A ?If yes, type (Cigarette, cigar, pipe, and/or chewing tobacco)? N/A   ?If former, year stopped? N/A ?Number of years? N/A ?Packs/number/containers per day? N/A ? ?Patient was thanked for her time and voluntary participation in this study. Patient was provided direct contact information and is encouraged to contact this nurse for any questions or concerns. ? ?Marilyn Penna, RN, BSN, CPN ?Clinical Research Nurse I ?[phone] ? ?12/14/2021 1:46 PM  ? ? ?This Nurse has reviewed this patient's inclusion and exclusion criteria as a second review and confirms Marilyn Hansen is eligible for study participation.  Patient may continue with enrollment.  ?Marilyn Fruit, RN ?12/14/21 ?3:50 PM ? ? ? ? ? ?

## 2021-12-15 ENCOUNTER — Encounter: Payer: Self-pay | Admitting: Genetic Counselor

## 2021-12-15 ENCOUNTER — Telehealth: Payer: Self-pay | Admitting: Genetic Counselor

## 2021-12-15 DIAGNOSIS — Z1379 Encounter for other screening for genetic and chromosomal anomalies: Secondary | ICD-10-CM | POA: Insufficient documentation

## 2021-12-15 DIAGNOSIS — Z1501 Genetic susceptibility to malignant neoplasm of breast: Secondary | ICD-10-CM | POA: Insufficient documentation

## 2021-12-15 HISTORY — DX: Genetic susceptibility to malignant neoplasm of breast: Z15.01

## 2021-12-15 NOTE — Telephone Encounter (Signed)
Contacted patient in attempt to disclose results of genetic testing.  Mailbox full and unable to leave message.  Sending message through MyChart requesting call.  ?

## 2021-12-16 ENCOUNTER — Telehealth: Payer: Self-pay | Admitting: Genetic Counselor

## 2021-12-16 NOTE — Telephone Encounter (Signed)
Discussed cancer risks, management, and family implications for CHEK2 mutation.  ? ? ? ?Pan-cancer panel pending.  ?

## 2021-12-17 ENCOUNTER — Telehealth: Payer: Self-pay | Admitting: Genetic Counselor

## 2021-12-17 ENCOUNTER — Ambulatory Visit: Payer: Self-pay | Admitting: Genetic Counselor

## 2021-12-17 ENCOUNTER — Encounter: Payer: Self-pay | Admitting: Genetic Counselor

## 2021-12-17 DIAGNOSIS — Z853 Personal history of malignant neoplasm of breast: Secondary | ICD-10-CM

## 2021-12-17 DIAGNOSIS — C50411 Malignant neoplasm of upper-outer quadrant of right female breast: Secondary | ICD-10-CM

## 2021-12-17 DIAGNOSIS — Z1379 Encounter for other screening for genetic and chromosomal anomalies: Secondary | ICD-10-CM

## 2021-12-17 DIAGNOSIS — Z1501 Genetic susceptibility to malignant neoplasm of breast: Secondary | ICD-10-CM

## 2021-12-17 NOTE — Telephone Encounter (Signed)
Called patient to let her know that [facility] information was sent to her email. Disclosed that pan-cancer panel was negative besides CHEK2 mutation.  ?

## 2021-12-17 NOTE — Progress Notes (Signed)
GENETIC TEST RESULTS ? ?Patient Name: Marilyn Hansen ?Patient Age: 66 y.o. ?Encounter Date: 12/16/2021 ? ?Referring Provider: ?Benay Pike, MD ? ?[name] was seen by genetics during the breast multidisciplinary clinic on December 08, 2021 due to a personal history of cancer and concern regarding a hereditary predisposition to cancer in the family. Please refer to the prior Genetics clinic note for more information regarding Ms. Lenz's medical and family histories and our assessment at the time.  ? ?FAMILY HISTORY:  ?We obtained a detailed, 4-generation family history.  Significant diagnoses are listed below: ?     ?Family History  ?Problem Relation Age of Onset  ? Throat cancer Maternal Aunt    ?      dx after 50  ?  ?[name] is unaware of previous family history of genetic testing for hereditary cancer risks. There is on reported Ashkenazi Jewish ancestry. There is no known consanguinity. ?  ? ? ?Genetic testing:  ? 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  No other pathogenic variants were detected in the [facility] CustomNext-Cancer +RNAinsight panel.   ? ?The CustomNext-Cancer+RNAinsight panel offered by [facility] includes sequencing and rearrangement analysis for the following 47 genes:  APC, ATM, AXIN2, BARD1, BMPR1A, BRCA1, BRCA2, BRIP1, CDH1, CDK4, CDKN2A, CHEK2, DICER1, EPCAM, GREM1, HOXB13, MEN1, MLH1, MSH2, MSH3, MSH6, MUTYH, NBN, NF1, NF2, NTHL1, PALB2, PMS2, POLD1, POLE, PTEN, RAD51C, RAD51D, RECQL, RET, SDHA, SDHAF2, SDHB, SDHC, SDHD, SMAD4, SMARCA4, STK11, TP53, TSC1, TSC2, and VHL.  RNA data is routinely analyzed for use in variant interpretation for all genes. ? ? ?The test report has been scanned into EPIC and is located under the Molecular Pathology section of the Results Review tab.  A portion of the result report is included below for reference. Genetic testing reported out on December 16, 2021.   ? ? ? ? ? ?Cancer Risks for CHEK2: ?Women have a 20-40% lifetime risk of breast cancer. ?Men are thought to be at an increased risk of prostate cancer. The exact risk figure is unknown at this time. ?8-9% lifetime risk of colorectal cancer ?Data suggests a possible increased risk for ovarian, endometrial, thyroid, and other types of cancer  ?  ?Research is continuing to help learn more about the cancers associated with CHEK2 pathogenic variants and what the exact risks are to develop these cancers. ? ?Management Recommendations: ? ?Breast Cancer Screening/Risk Reduction: ? ?Women: ?Breast cancer screening includes: ?Breast awareness beginning at age 60 ?Monthly self-breast examination beginning at age 66 ?Clinical breast examination every 6-12 months beginning at age 61 or at the age of the earliest diagnosed breast cancer in the family, if onset was before age 27 ?Annual mammogram with consideration of tomosynthesis starting at age 49 or 18 years prior to the youngest age of diagnosis, whichever comes first ?Consider breast MRI with contrast starting at age 33-35 ?Evidence is insufficient for a prophylactic risk-reducing mastectomy, manage based on family history  ? ?Men: ?There are currently no specific medical management guidelines for female breast cancer. However, as an association has been seen with CHEK2 mutations and female breast cancer, we recommend males consider annual clinical breast exams beginning at age 57. ? ?Colon Cancer Screening: ?Colonoscopy screening every 5 years beginning at age 52 ?If an individual has a first-degree relative with colorectal cancer, screening should begin 10 years prior to the relative?s age at diagnosis if before 28. ?If an individual has a  personal history of colorectal cancer, screening recommendations should be based on recommendations for post-colorectal cancer resection. ? ?Prostate Cancer Screening: ?It has been suggested that men with a CHEK2 pathogenic variant and a  first-degree relative with prostate cancer have an annual prostate-specific antigen (PSA). However, the benefits of screening for prostate cancer among men with a pathogenic variant in CHEK2 are uncertain. ?Consider beginning annual PSA blood test and digital rectal exams at age 3-45 ? ? ?This information is based on current understanding of the gene and may change in the future. ? ? ?Implications for Family Members: ?Hereditary predisposition to cancer due to pathogenic variants in the CHEK2 gene has autosomal dominant inheritance. This means that first degree relatives have a 50% chance of also having the same CHEK2 mutation. Identification of a pathogenic variant allows for the recognition of at-risk relatives who can pursue testing for the familial variant.  ? ?Family members are encouraged to consider genetic testing for this familial pathogenic variant. As there are generally no childhood cancer risks associated with pathogenic variants in the CHEK2 gene, individuals in the family are not recommended to have testing until they reach at least 63 years of age. Complimentary testing for the familial variant is available for 90 days. They may contact our office at [phone] for more information or to schedule an appointment. Family members who live outside of the area are encouraged to find a genetic counselor in their area by visiting: [email or web address].  ? ?Resources: ?FORCE (Facing Our Risk of Cancer Empowered) is a resource for those with a hereditary predisposition to develop cancer.  FORCE provides information about risk reduction, advocacy, legislation, and clinical trials.  Additionally, [employer]CE provides a platform for collaboration and support; which includes: peer navigation, message boards, local support groups, a toll-free helpline, research registry and recruitment, advocate training, published medical research, webinars, brochures, mastectomy photos, and more.  For more  information, visit www.facingourrisk.org ? ?Plan:  ?[name]'s oncology team was notified of this result.  She knows that Dr. Chryl Heck may further discuss appropriate breast cancer management/screening in the future.   ?Ms. Heaphy is already followed by Dr. Alessandra Bevels at [facility] for regular colonoscopies.  She is scheduled for a colonoscopy in June of this year.  We encouraged her to have colonoscopies every 5 years or as recommended by her GI providers.  ?Ms. Goforth knows that her siblings and other relatives (both maternal and paternal) should be offered genetic testing based on her CHEK2 mutation.  She will be provided with a family sharing letter to help her communicate this result to family members.  ? ?Our contact number was provided. Ms. Banwart questions were answered to her satisfaction, and she knows she is welcome to call us at anytime with additional questions or concerns.  ? ?Alayiah Fontes M. Joette Catching, MS, [place] ?Certified Genetic Counselor ?[email or web address] ?(P) [phone] ? ? ?

## 2021-12-29 ENCOUNTER — Encounter (HOSPITAL_COMMUNITY): Payer: Self-pay

## 2021-12-29 ENCOUNTER — Ambulatory Visit (HOSPITAL_BASED_OUTPATIENT_CLINIC_OR_DEPARTMENT_OTHER)
Admission: RE | Admit: 2021-12-29 | Discharge: 2021-12-29 | Disposition: A | Discharge status: Home / Self Care | Payer: Managed Care, Other (non HMO) | Source: Ambulatory Visit | Attending: Radiation Oncology | Admitting: Radiation Oncology

## 2021-12-29 ENCOUNTER — Ambulatory Visit (HOSPITAL_COMMUNITY)
Admission: RE | Admit: 2021-12-29 | Discharge: 2021-12-29 | Disposition: A | Discharge status: Home / Self Care | Payer: Managed Care, Other (non HMO) | Source: Ambulatory Visit | Attending: Radiation Oncology | Admitting: Radiation Oncology

## 2021-12-29 DIAGNOSIS — Z8249 Family history of ischemic heart disease and other diseases of the circulatory system: Secondary | ICD-10-CM | POA: Diagnosis present

## 2021-12-29 DIAGNOSIS — R011 Cardiac murmur, unspecified: Secondary | ICD-10-CM

## 2021-12-29 DIAGNOSIS — Z136 Encounter for screening for cardiovascular disorders: Secondary | ICD-10-CM | POA: Insufficient documentation

## 2021-12-29 LAB — ECHOCARDIOGRAM COMPLETE: S' Lateral: 2.1 cm

## 2021-12-31 ENCOUNTER — Encounter: Payer: Self-pay | Admitting: Radiation Oncology

## 2021-12-31 NOTE — Progress Notes (Signed)
Marilyn Hansen called because she is wondering about the results to her cardiac studies.  She does not have an appointment with a cardiologist yet.  Our team is working on getting her an appointment with a cardiologist to review the results to the studies and figure out what surveillance or interventions may be warranted.  To the best of my ability I helped her review the results from her studies which appear to show some mild cardiac dysfunction and valvular calcification which may have contributed to the murmur that I auscultated at her consultation.  She was reassured by our discussion and looks forward to seeing a cardiologist. ?----------------------------------- ? ?Eppie Gibson, MD ? ?

## 2022-01-05 ENCOUNTER — Other Ambulatory Visit: Payer: Self-pay

## 2022-01-05 ENCOUNTER — Encounter (HOSPITAL_BASED_OUTPATIENT_CLINIC_OR_DEPARTMENT_OTHER): Payer: Self-pay | Admitting: Surgery

## 2022-01-07 ENCOUNTER — Telehealth: Payer: Self-pay

## 2022-01-07 NOTE — Telephone Encounter (Signed)
Called patient to relay information sent in [place] message yesterday. Patient did not answer, and unable to leave a VM due to mailbox being full. Will try again later today if patient does not respond to MyChart message

## 2022-01-07 NOTE — Telephone Encounter (Signed)
Called again. Patient did not answer, and unable to leave VM

## 2022-01-10 ENCOUNTER — Telehealth: Payer: Self-pay

## 2022-01-10 NOTE — Telephone Encounter (Signed)
Connected with patient via telephone to relayed cardiologist's recommendations of non-urgent sleep study and continuing BP management per PCP. Patient verbalized understanding and appreciation of call. Provided my direct call back number should she have any future questions/concerns. No other needs identified at this time.   Also called patient's PCP office (Dr. Kathyrn Lass with [facility]) and relayed recommendations to staff member Christina. Provided my direct number, and requested call-back to confirm Dr. Sabra Heck had received information

## 2022-01-11 ENCOUNTER — Ambulatory Visit
Admission: RE | Admit: 2022-01-11 | Discharge: 2022-01-11 | Disposition: A | Discharge status: Home / Self Care | Payer: Managed Care, Other (non HMO) | Source: Ambulatory Visit | Attending: Surgery | Admitting: Surgery

## 2022-01-11 ENCOUNTER — Encounter (HOSPITAL_BASED_OUTPATIENT_CLINIC_OR_DEPARTMENT_OTHER)
Admission: RE | Admit: 2022-01-11 | Discharge: 2022-01-11 | Disposition: A | Discharge status: Home / Self Care | Payer: Managed Care, Other (non HMO) | Source: Ambulatory Visit | Attending: Surgery | Admitting: Surgery

## 2022-01-11 DIAGNOSIS — I10 Essential (primary) hypertension: Secondary | ICD-10-CM | POA: Insufficient documentation

## 2022-01-11 DIAGNOSIS — Z853 Personal history of malignant neoplasm of breast: Secondary | ICD-10-CM

## 2022-01-11 DIAGNOSIS — Z01810 Encounter for preprocedural cardiovascular examination: Secondary | ICD-10-CM | POA: Diagnosis present

## 2022-01-11 DIAGNOSIS — N6489 Other specified disorders of breast: Secondary | ICD-10-CM

## 2022-01-11 LAB — BASIC METABOLIC PANEL
Anion gap: 9 (ref 5–15)
BUN: 18 mg/dL (ref 8–23)
CO2: 28 mmol/L (ref 22–32)
Calcium: 10.4 mg/dL — ABNORMAL HIGH (ref 8.9–10.3)
Chloride: 101 mmol/L (ref 98–111)
Creatinine, Ser: 0.73 mg/dL (ref 0.44–1.00)
GFR, Estimated: >60 mL/min (ref >60)
Glucose, Bld: 98 mg/dL (ref 70–99)
Potassium: 3.5 mmol/L (ref 3.5–5.1)
Sodium: 138 mmol/L (ref 135–145)

## 2022-01-11 MED ORDER — CHLORHEXIDINE GLUCONATE CLOTH 2 % EX PADS: 6.0000 | MEDICATED_PAD | Freq: Once | CUTANEOUS | Status: DC

## 2022-01-11 NOTE — Progress Notes (Signed)

## 2022-01-12 NOTE — H&P (Signed)
REFERRING PHYSICIAN: Mel Almond*  PROVIDER: Beverlee Nims, MD  MRN: [id number] DOB: April 29, 1956  Subjective   Chief Complaint: Breast Cancer   History of Present Illness: Marilyn Hansen is a 66 y.o. female who is seen today as an office consultation for evaluation of Breast Cancer .   This is a 66 year old female who was recently found on screening mammography to have a small mass as well as an architectural distortion in the right breast. The mass measures 1 cm. It was biopsied showing both invasive and in situ ductal carcinoma. The distortion showed a complex sclerosing lesion. Cancer was 1% ER positive, 100% PR positive, HER2 negative, and had a Ki-67 of 10%. She has a history of previous left breast cancer in 2008 and had undergone a lumpectomy, axillary dissection, radiation therapy, and chemotherapy. She denies nipple discharge. She has had previous breast implants with removal. She is otherwise healthy without complaints.  Review of Systems: A complete review of systems was obtained from the patient. I have reviewed this information and discussed as appropriate with the patient. See HPI as well for other ROS.  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   Past Surgical History:  Procedure Laterality Date   Bilateral Salpinoophorectomy Bilateral  06/10   Breast Lumpectomy Left  2008   REDUCTION MAMMAPLASTY BILATERAL Bilateral  2009    Allergies  Allergen Reactions   Cold Apap/Non Drowsy [Pseudoephed-Dm-Acetaminophen] Hives   Lisinopril Cough   Current Outpatient Medications on File Prior to Visit  Medication Sig Dispense Refill   ascorbic acid, vitamin C, (VITAMIN C) 1000 MG tablet Take by  mouth   atorvastatin (LIPITOR) 40 MG tablet Take 40 mg by mouth once daily   cholecalciferol (VITAMIN D3) 1,250 mcg (50,000 unit) capsule Take by mouth   multivitamin with minerals tablet Take by mouth once daily   traZODone (DESYREL) 50 MG tablet Take by mouth   zinc gluconate 50 mg tablet Take 50 mg by mouth once daily   hydroCHLOROthiazide (HYDRODIURIL) 25 MG tablet Take 25 mg by mouth once daily   No current facility-administered medications on file prior to visit.   Family History  Problem Relation Age of Onset   Coronary Artery Disease (Blocked arteries around heart) Brother   High blood pressure (Hypertension) Brother    Social History   Tobacco Use  Smoking Status Never  Smokeless Tobacco Never    Social History   Socioeconomic History   Marital status: Unknown  Tobacco Use   Smoking status: Never   Smokeless tobacco: Never  Vaping Use   Vaping Use: Never used  Substance and Sexual Activity   Alcohol use: Not Currently   Drug use: Never   Objective:  There were no vitals filed for this visit.  There is no height or weight on file to calculate BMI.  Physical Exam   She appears well on exam  There are no palpable breast masses. There is mild ecchymosis on right breast from her biopsy. The nipple areolar complex is normal. There is no axillary adenopathy  Labs, Imaging and Diagnostic Testing: I reviewed her labs, mammograms, pathology results, and ultrasound  Assessment and Plan:   Diagnoses and all  orders for this visit:  Invasive ductal carcinoma of breast, female, right (CMS-HCC)  Complex sclerosing lesion of right breast    I had a long discussion with the patient and her husband. We also discussed her at length this morning in her multidisciplinary breast cancer conference. We discussed breast cancer in general. From a surgical standpoint we discussed breast conservation versus mastectomy. Currently she is interested in breast conservation as she  has had this in the past but may eventually change her mind given this is her second breast cancer. We discussed proceeding with a radioactive seed guided right breast lumpectomy with 2 separate seed to remove both cancer and complex sclerosing lesion as well as a sentinel lymph node biopsy. I explained the surgical procedures in detail. We discussed the risks which includes but is not limited to bleeding, infection, injury to surrounding structures, the need for further surgery if margins are positive, arm swelling, postoperative recovery, etc. This point, surgery will be scheduled.

## 2022-01-13 ENCOUNTER — Ambulatory Visit (HOSPITAL_BASED_OUTPATIENT_CLINIC_OR_DEPARTMENT_OTHER): Payer: Managed Care, Other (non HMO) | Admitting: Anesthesiology

## 2022-01-13 ENCOUNTER — Ambulatory Visit
Admission: RE | Admit: 2022-01-13 | Discharge: 2022-01-13 | Disposition: A | Discharge status: Home / Self Care | Payer: Managed Care, Other (non HMO) | Source: Ambulatory Visit | Attending: Surgery | Admitting: Surgery

## 2022-01-13 ENCOUNTER — Other Ambulatory Visit: Payer: Self-pay

## 2022-01-13 ENCOUNTER — Ambulatory Visit (HOSPITAL_BASED_OUTPATIENT_CLINIC_OR_DEPARTMENT_OTHER)
Admission: RE | Admit: 2022-01-13 | Discharge: 2022-01-13 | Disposition: A | Discharge status: Home / Self Care | Payer: Managed Care, Other (non HMO) | Attending: Surgery | Admitting: Surgery

## 2022-01-13 ENCOUNTER — Encounter (HOSPITAL_BASED_OUTPATIENT_CLINIC_OR_DEPARTMENT_OTHER): Payer: Self-pay | Admitting: Surgery

## 2022-01-13 ENCOUNTER — Encounter (HOSPITAL_BASED_OUTPATIENT_CLINIC_OR_DEPARTMENT_OTHER)
Admission: RE | Disposition: A | Discharge status: Home / Self Care | Payer: Self-pay | Source: Home / Self Care | Attending: Surgery

## 2022-01-13 DIAGNOSIS — Z6832 Body mass index (BMI) 32.0-32.9, adult: Secondary | ICD-10-CM | POA: Insufficient documentation

## 2022-01-13 DIAGNOSIS — C50411 Malignant neoplasm of upper-outer quadrant of right female breast: Secondary | ICD-10-CM | POA: Insufficient documentation

## 2022-01-13 DIAGNOSIS — I10 Essential (primary) hypertension: Secondary | ICD-10-CM | POA: Insufficient documentation

## 2022-01-13 DIAGNOSIS — F419 Anxiety disorder, unspecified: Secondary | ICD-10-CM | POA: Diagnosis not present

## 2022-01-13 DIAGNOSIS — Z853 Personal history of malignant neoplasm of breast: Secondary | ICD-10-CM | POA: Insufficient documentation

## 2022-01-13 DIAGNOSIS — Z9221 Personal history of antineoplastic chemotherapy: Secondary | ICD-10-CM | POA: Diagnosis not present

## 2022-01-13 DIAGNOSIS — Z170 Estrogen receptor positive status [ER+]: Secondary | ICD-10-CM | POA: Diagnosis not present

## 2022-01-13 DIAGNOSIS — N6489 Other specified disorders of breast: Secondary | ICD-10-CM

## 2022-01-13 DIAGNOSIS — E669 Obesity, unspecified: Secondary | ICD-10-CM | POA: Insufficient documentation

## 2022-01-13 DIAGNOSIS — F32A Depression, unspecified: Secondary | ICD-10-CM | POA: Insufficient documentation

## 2022-01-13 DIAGNOSIS — C50911 Malignant neoplasm of unspecified site of right female breast: Secondary | ICD-10-CM | POA: Diagnosis not present

## 2022-01-13 DIAGNOSIS — E785 Hyperlipidemia, unspecified: Secondary | ICD-10-CM | POA: Diagnosis not present

## 2022-01-13 DIAGNOSIS — Z923 Personal history of irradiation: Secondary | ICD-10-CM | POA: Insufficient documentation

## 2022-01-13 HISTORY — DX: Anxiety disorder, unspecified: F41.9

## 2022-01-13 HISTORY — DX: Other specified postprocedural states: Z98.890

## 2022-01-13 HISTORY — PX: BREAST LUMPECTOMY WITH RADIOACTIVE SEED AND SENTINEL LYMPH NODE BIOPSY: SHX6550

## 2022-01-13 HISTORY — DX: Depression, unspecified: F32.A

## 2022-01-13 SURGERY — BREAST LUMPECTOMY WITH RADIOACTIVE SEED AND SENTINEL LYMPH NODE BIOPSY
Anesthesia: General | Site: Breast | Laterality: Right

## 2022-01-13 MED ORDER — MIDAZOLAM HCL 2 MG/2ML IJ SOLN
INTRAMUSCULAR | Status: AC
  Filled 2022-01-13: qty 2

## 2022-01-13 MED ORDER — FENTANYL CITRATE (PF) 100 MCG/2ML IJ SOLN
INTRAMUSCULAR | Status: DC | PRN
  Administered 2022-01-13: 50 ug via INTRAVENOUS
  Administered 2022-01-13 (×2): 25 ug via INTRAVENOUS

## 2022-01-13 MED ORDER — MAGTRACE LYMPHATIC TRACER
INTRAMUSCULAR | Status: DC | PRN
  Administered 2022-01-13: 2 mL via INTRAMUSCULAR

## 2022-01-13 MED ORDER — AMISULPRIDE (ANTIEMETIC) 5 MG/2ML IV SOLN: 10.0000 mg | Freq: Once | INTRAVENOUS | Status: DC | PRN

## 2022-01-13 MED ORDER — LIDOCAINE HCL (CARDIAC) PF 100 MG/5ML IV SOSY
PREFILLED_SYRINGE | INTRAVENOUS | Status: DC | PRN
  Administered 2022-01-13: 50 mg via INTRAVENOUS

## 2022-01-13 MED ORDER — ONDANSETRON HCL 4 MG/2ML IJ SOLN
INTRAMUSCULAR | Status: AC
  Filled 2022-01-13: qty 2

## 2022-01-13 MED ORDER — PROPOFOL 10 MG/ML IV BOLUS
INTRAVENOUS | Status: DC | PRN
  Administered 2022-01-13: 180 mg via INTRAVENOUS

## 2022-01-13 MED ORDER — CEFAZOLIN SODIUM-DEXTROSE 2-4 GM/100ML-% IV SOLN
INTRAVENOUS | Status: AC
  Filled 2022-01-13: qty 100

## 2022-01-13 MED ORDER — ONDANSETRON HCL 4 MG/2ML IJ SOLN: 4.0000 mg | Freq: Once | INTRAMUSCULAR | Status: DC | PRN

## 2022-01-13 MED ORDER — TRAMADOL HCL 50 MG PO TABS: 50.0000 mg | ORAL_TABLET | Freq: Four times a day (QID) | ORAL | 0 refills | Status: DC | PRN

## 2022-01-13 MED ORDER — EPHEDRINE SULFATE (PRESSORS) 50 MG/ML IJ SOLN
INTRAMUSCULAR | Status: DC | PRN
  Administered 2022-01-13: 10 mg via INTRAVENOUS
  Administered 2022-01-13: 5 mg via INTRAVENOUS

## 2022-01-13 MED ORDER — DEXAMETHASONE SODIUM PHOSPHATE 4 MG/ML IJ SOLN
INTRAMUSCULAR | Status: DC | PRN
  Administered 2022-01-13: 5 mg via INTRAVENOUS

## 2022-01-13 MED ORDER — BUPIVACAINE HCL (PF) 0.5 % IJ SOLN
INTRAMUSCULAR | Status: DC | PRN
  Administered 2022-01-13: 20 mL via PERINEURAL

## 2022-01-13 MED ORDER — FENTANYL CITRATE (PF) 100 MCG/2ML IJ SOLN
INTRAMUSCULAR | Status: AC
Start: 2022-01-13 — End: ?
  Filled 2022-01-13: qty 2

## 2022-01-13 MED ORDER — DEXAMETHASONE SODIUM PHOSPHATE 10 MG/ML IJ SOLN
INTRAMUSCULAR | Status: AC
  Filled 2022-01-13: qty 1

## 2022-01-13 MED ORDER — MIDAZOLAM HCL 2 MG/2ML IJ SOLN
2.0000 mg | Freq: Once | INTRAMUSCULAR | Status: AC
  Administered 2022-01-13: 2 mg via INTRAVENOUS

## 2022-01-13 MED ORDER — BUPIVACAINE-EPINEPHRINE (PF) 0.5% -1:200000 IJ SOLN
INTRAMUSCULAR | Status: AC
  Filled 2022-01-13: qty 60

## 2022-01-13 MED ORDER — FENTANYL CITRATE (PF) 100 MCG/2ML IJ SOLN
100.0000 ug | Freq: Once | INTRAMUSCULAR | Status: AC
  Administered 2022-01-13: 50 ug via INTRAVENOUS

## 2022-01-13 MED ORDER — ENSURE PRE-SURGERY PO LIQD: 296.0000 mL | Freq: Once | ORAL | Status: DC

## 2022-01-13 MED ORDER — PROPOFOL 10 MG/ML IV BOLUS
INTRAVENOUS | Status: AC
  Filled 2022-01-13: qty 20

## 2022-01-13 MED ORDER — LIDOCAINE 2% (20 MG/ML) 5 ML SYRINGE
INTRAMUSCULAR | Status: AC
  Filled 2022-01-13: qty 5

## 2022-01-13 MED ORDER — CEFAZOLIN SODIUM-DEXTROSE 2-4 GM/100ML-% IV SOLN
2.0000 g | INTRAVENOUS | Status: AC
  Administered 2022-01-13: 2 g via INTRAVENOUS

## 2022-01-13 MED ORDER — OXYCODONE HCL 5 MG PO TABS: 5.0000 mg | ORAL_TABLET | Freq: Once | ORAL | Status: DC | PRN

## 2022-01-13 MED ORDER — BUPIVACAINE-EPINEPHRINE 0.5% -1:200000 IJ SOLN
INTRAMUSCULAR | Status: DC | PRN
  Administered 2022-01-13: 20 mL

## 2022-01-13 MED ORDER — BUPIVACAINE LIPOSOME 1.3 % IJ SUSP
INTRAMUSCULAR | Status: DC | PRN
  Administered 2022-01-13: 10 mL via PERINEURAL

## 2022-01-13 MED ORDER — ONDANSETRON HCL 4 MG/2ML IJ SOLN
INTRAMUSCULAR | Status: DC | PRN
  Administered 2022-01-13: 4 mg via INTRAVENOUS

## 2022-01-13 MED ORDER — FENTANYL CITRATE (PF) 100 MCG/2ML IJ SOLN
INTRAMUSCULAR | Status: AC
  Filled 2022-01-13: qty 2

## 2022-01-13 MED ORDER — FENTANYL CITRATE (PF) 100 MCG/2ML IJ SOLN: 25.0000 ug | INTRAMUSCULAR | Status: DC | PRN

## 2022-01-13 MED ORDER — MIDAZOLAM HCL 5 MG/5ML IJ SOLN
INTRAMUSCULAR | Status: DC | PRN
  Administered 2022-01-13 (×2): 1 mg via INTRAVENOUS

## 2022-01-13 MED ORDER — KETOROLAC TROMETHAMINE 30 MG/ML IJ SOLN
INTRAMUSCULAR | Status: AC
  Filled 2022-01-13: qty 1

## 2022-01-13 MED ORDER — OXYCODONE HCL 5 MG/5ML PO SOLN: 5.0000 mg | Freq: Once | ORAL | Status: DC | PRN

## 2022-01-13 MED ORDER — KETOROLAC TROMETHAMINE 30 MG/ML IJ SOLN
INTRAMUSCULAR | Status: DC | PRN
Start: 2022-01-13 — End: 2022-01-13
  Administered 2022-01-13: 30 mg via INTRAVENOUS

## 2022-01-13 MED ORDER — LACTATED RINGERS IV SOLN: INTRAVENOUS | Status: DC

## 2022-01-13 SURGICAL SUPPLY — 45 items
ADH SKN CLS APL DERMABOND .7 (GAUZE/BANDAGES/DRESSINGS) ×1
APL PRP STRL LF DISP 70% ISPRP (MISCELLANEOUS) ×1
APPLIER CLIP 9.375 MED OPEN (MISCELLANEOUS) ×4
APR CLP MED 9.3 20 MLT OPN (MISCELLANEOUS) ×2
BLADE SURG 15 STRL LF DISP TIS (BLADE) ×1 IMPLANT
BLADE SURG 15 STRL SS (BLADE) ×2
CHLORAPREP W/TINT 26 (MISCELLANEOUS) ×2 IMPLANT
CLIP APPLIE 9.375 MED OPEN (MISCELLANEOUS) ×1 IMPLANT
COVER BACK TABLE 60X90IN (DRAPES) ×2 IMPLANT
COVER MAYO STAND STRL (DRAPES) ×2 IMPLANT
COVER PROBE W GEL 5X96 (DRAPES) ×2 IMPLANT
DERMABOND ADVANCED (GAUZE/BANDAGES/DRESSINGS) ×1
DERMABOND ADVANCED .7 DNX12 (GAUZE/BANDAGES/DRESSINGS) ×1 IMPLANT
DRAPE LAPAROSCOPIC ABDOMINAL (DRAPES) ×2 IMPLANT
DRAPE UTILITY XL STRL (DRAPES) ×2 IMPLANT
ELECT REM PT RETURN 9FT ADLT (ELECTROSURGICAL) ×2
ELECTRODE REM PT RTRN 9FT ADLT (ELECTROSURGICAL) ×1 IMPLANT
GLOVE BIOGEL PI IND STRL 7.0 (GLOVE) IMPLANT
GLOVE BIOGEL PI INDICATOR 7.0 (GLOVE) ×1
GLOVE INDICATOR 7.0 STRL GRN (GLOVE) ×1 IMPLANT
GLOVE SURG SIGNA 7.5 PF LTX (GLOVE) ×2 IMPLANT
GLOVE SURG SS PI 7.0 STRL IVOR (GLOVE) ×1 IMPLANT
GOWN STRL REUS W/ TWL LRG LVL3 (GOWN DISPOSABLE) ×1 IMPLANT
GOWN STRL REUS W/ TWL XL LVL3 (GOWN DISPOSABLE) ×1 IMPLANT
GOWN STRL REUS W/TWL LRG LVL3 (GOWN DISPOSABLE) ×2
GOWN STRL REUS W/TWL XL LVL3 (GOWN DISPOSABLE) ×2
KIT MARKER MARGIN INK (KITS) ×2 IMPLANT
NDL HYPO 25X1 1.5 SAFETY (NEEDLE) ×1 IMPLANT
NDL SAFETY ECLIPSE 18X1.5 (NEEDLE) ×1 IMPLANT
NEEDLE HYPO 18GX1.5 SHARP (NEEDLE) ×2
NEEDLE HYPO 25X1 1.5 SAFETY (NEEDLE) ×2 IMPLANT
NS IRRIG 1000ML POUR BTL (IV SOLUTION) ×2 IMPLANT
PACK BASIN DAY SURGERY FS (CUSTOM PROCEDURE TRAY) ×2 IMPLANT
PENCIL SMOKE EVACUATOR (MISCELLANEOUS) ×2 IMPLANT
SLEEVE SCD COMPRESS KNEE MED (STOCKING) ×2 IMPLANT
SPONGE T-LAP 4X18 ~~LOC~~+RFID (SPONGE) ×2 IMPLANT
SUT MNCRL AB 4-0 PS2 18 (SUTURE) ×2 IMPLANT
SUT VIC AB 3-0 SH 27 (SUTURE) ×4
SUT VIC AB 3-0 SH 27X BRD (SUTURE) ×1 IMPLANT
SYR CONTROL 10ML LL (SYRINGE) ×2 IMPLANT
TOWEL GREEN STERILE FF (TOWEL DISPOSABLE) ×2 IMPLANT
TRACER MAGTRACE VIAL (MISCELLANEOUS) ×1 IMPLANT
TRAY FAXITRON CT DISP (TRAY / TRAY PROCEDURE) ×2 IMPLANT
TUBE CONNECTING 20X1/4 (TUBING) ×1 IMPLANT
YANKAUER SUCT BULB TIP NO VENT (SUCTIONS) ×1 IMPLANT

## 2022-01-13 NOTE — Interval H&P Note (Signed)
History and Physical Interval Note:no change in H and P  01/13/2022 9:28 AM  Marilyn Hansen  has presented today for surgery, with the diagnosis of RIGHT BREAST CANCER, RIGHT COMPLEX SCLEROSING LESION.  The various methods of treatment have been discussed with the patient and family. After consideration of risks, benefits and other options for treatment, the patient has consented to  Procedure(s): RIGHT BREAST LUMPECTOMY WITH RADIOACTIVE SEED X 2 AND SENTINEL LYMPH NODE BIOPSY (Right) as a surgical intervention.  The patient's history has been reviewed, patient examined, no change in status, stable for surgery.  I have reviewed the patient's chart and labs.  Questions were answered to the patient's satisfaction.     [name]

## 2022-01-13 NOTE — Transfer of Care (Signed)
Immediate Anesthesia Transfer of Care Note  Patient: Marilyn Hansen  Procedure(s) Performed: RIGHT BREAST LUMPECTOMY WITH RADIOACTIVE SEED X 2 AND SENTINEL LYMPH NODE BIOPSY (Right: Breast)  Patient Location: PACU  Anesthesia Type:General  Level of Consciousness: awake, alert  and oriented  Airway & Oxygen Therapy: Patient Spontanous Breathing and Patient connected to nasal cannula oxygen  Post-op Assessment: Report given to RN and Post -op Vital signs reviewed and stable  Post vital signs: Reviewed and stable  Last Vitals:  Vitals Value Taken Time  BP    Temp    Pulse    Resp    SpO2      Last Pain:  Vitals:   01/13/22 0938  TempSrc: Oral  PainSc: 0-No pain      Patients Stated Pain Goal: 7 (03/75/43 6067)  Complications: No notable events documented.

## 2022-01-13 NOTE — Op Note (Signed)
Marilyn Hansen 01/13/2022   Pre-op Diagnosis: RIGHT BREAST CANCER, RIGHT COMPLEX SCLEROSING LESION     Post-op Diagnosis: same  Procedure(s): RIGHT BREAST LUMPECTOMY WITH RADIOACTIVE SEED X 2 AND DEEP AXILLARY SENTINEL LYMPH NODE BIOPSY INJECTION OF MAGTRACE FOR LYMPH NODE MAPPING  Surgeon(s): [name], MD  Anesthesia: General  Staff:  Circulator: Merwyn Katos, RN; Izora Ribas, RN Scrub Person: Ward, Scarlette Calico  Estimated Blood Loss: Minimal               Specimens: sent to path  Indications: This is a 66 year old female was found to have 2 suspicious areas on screening mammography of the right breast.  1 biopsy showed a complex grossing lesion and the other showed invasive ductal carcinoma with ductal carcinoma in situ.  The decision was made to proceed with radioactive seed lumpectomies of both areas in the right breast and sentinel lymph node biopsy  Procedure: The patient was brought to the operating room and identifies the correct patient.  She is placed upon the operating table general anesthesia was induced.  Her right breast was prepped and mag trace was injected under the nipple areolar complex and then the breast was massaged.  Her right breast and axilla were then prepped and draped in usual sterile fashion.  I identified the radioactive seeds at what appeared to be the 9 o'clock position and 11 o'clock position of the right breast.  I anesthetized the lateral edge of the areola with Marcaine and then made a circumareolar incision with a scalpel.  I then dissected down to the breast tissue and slightly lateral with electrocautery.  Identified the lower of the 2 seeds with the neoprobe and performed a lumpectomy staying around the radioactive seed widely.  Once the specimen was removed I marked the margins with paint and an x-ray confirmed that the radioactive seed and biopsy clip were in the specimen.  With the neoprobe I then identified the second seed which  was more superior.  I again performed a lumpectomy attempting to stay widely around the radioactive seed with the aid of the neoprobe.  Again the specimen was removed completely.  An x-ray was performed confirming the radioactive seed and tissue marker in the specimen as well.  I had marked the margins of both specimens with paint prior to x-ray.  Hemostasis appeared to be achieved in the lumpectomy site.  I then packed this with a gauze. I next identified an area of increased uptake of mag trace in the right axilla with the mag trace probe.  I anesthetized the skin with Marcaine and then made incision with a scalpel.  I then dissected down to the deep right axillary tissue.  Identified what appeared to be 2 sentinel lymph nodes with the mag trace probe and excised these with electrocautery and surgical clips.  These lymph nodes were sent to pathology.  There were no other palpable nodes in the axilla and no other increased uptake of the mag trace.  I reevaluate the lumpectomy site and hemostasis.  To be achieved.  I injected further Marcaine into the incision and then placed surgical clips around the periphery of the biopsy cavity.  Then closed both incisions with interrupted 3-0 Vicryl sutures and 4-0 Monocryl sutures.  Dermabond was then applied.  The patient tolerated the procedure well.  All the counts were correct at the end of the procedure.  The patient was then extubated in the operating room and taken in stable condition to the  recovery room.          [name]   Date: 01/13/2022  Time: 12:10 PM

## 2022-01-13 NOTE — Anesthesia Procedure Notes (Signed)
Anesthesia Regional Block: Pectoralis block   Pre-Anesthetic Checklist: , timeout performed,  Correct Patient, Correct Site, Correct Laterality,  Correct Procedure, Correct Position, site marked,  Risks and benefits discussed,  Surgical consent,  Pre-op evaluation,  At surgeon's request and post-op pain management  Laterality: Right  Prep: chloraprep       Needles:  Injection technique: Single-shot  Needle Type: Echogenic Stimulator Needle     Needle Length: 10cm  Needle Gauge: 21   Needle insertion depth: 8 cm   Additional Needles:   Procedures:,,,, ultrasound used (permanent image in chart),,    Narrative:  Start time: 01/13/2022 9:22 AM End time: 01/13/2022 9:27 AM Injection made incrementally with aspirations every 5 mL.  Performed by: Personally  Anesthesiologist: Josephine Igo, MD  Additional Notes: Timeout performed. Patient sedated. Relevant anatomy ID'd using [place]. Incremental 2-11m injection of LA with frequent aspiration. Patient tolerated procedure well.    Right Pectoralis Block

## 2022-01-13 NOTE — Anesthesia Postprocedure Evaluation (Signed)
Anesthesia Post Note  Patient: Marilyn [facility] [employer]  Procedure(s) Performed: RIGHT BREAST LUMPECTOMY WITH RADIOACTIVE SEED X 2 AND SENTINEL LYMPH NODE BIOPSY (Right: Breast)     Patient location during evaluation: PACU Anesthesia Type: General and Regional Level of consciousness: awake and alert Pain management: pain level controlled Vital Signs Assessment: post-procedure vital signs reviewed and stable Respiratory status: spontaneous breathing, nonlabored ventilation, respiratory function stable and patient connected to nasal cannula oxygen Cardiovascular status: blood pressure returned to baseline and stable Postop Assessment: no apparent nausea or vomiting Anesthetic complications: no   No notable events documented.  Last Vitals:  Vitals:   01/13/22 1217 01/13/22 1230  BP: (!) 144/65 136/63  Pulse: (!) 101 90  Resp: (!) 24 15  Temp: 36.7 C   SpO2: 95% 94%    Last Pain:  Vitals:   01/13/22 1217  TempSrc:   PainSc: 0-No pain                 Godric Lavell L Stetson Pelaez

## 2022-01-13 NOTE — Progress Notes (Signed)
Assisted Dr. Foster with right, pectoralis, ultrasound guided block. Side rails up, monitors on throughout procedure. See vital signs in flow sheet. Tolerated Procedure well. 

## 2022-01-13 NOTE — Anesthesia Procedure Notes (Signed)
Procedure Name: LMA Insertion Date/Time: 01/13/2022 11:07 AM Performed by: Bufford Spikes, CRNA Pre-anesthesia Checklist: Patient identified, Emergency Drugs available, Suction available and Patient being monitored Patient Re-evaluated:Patient Re-evaluated prior to induction Oxygen Delivery Method: Circle system utilized Preoxygenation: Pre-oxygenation with 100% oxygen Induction Type: IV induction Ventilation: Mask ventilation without difficulty LMA: LMA inserted LMA Size: 4.0 Number of attempts: 1 Placement Confirmation: positive ETCO2 Tube secured with: Tape Dental Injury: Teeth and Oropharynx as per pre-operative assessment

## 2022-01-13 NOTE — Discharge Instructions (Addendum)
[facility] Office Phone Number [phone]  BREAST BIOPSY/ PARTIAL MASTECTOMY: POST OP INSTRUCTIONS  Always review your discharge instruction sheet given to you by the facility where your surgery was performed.  IF YOU HAVE DISABILITY OR FAMILY LEAVE FORMS, YOU MUST BRING THEM TO THE OFFICE FOR PROCESSING.  DO NOT GIVE THEM TO YOUR DOCTOR.  A prescription for pain medication may be given to you upon discharge.  Take your pain medication as prescribed, if needed.  If narcotic pain medicine is not needed, then you may take acetaminophen (Tylenol) or ibuprofen (Advil) as needed. Take your usually prescribed medications unless otherwise directed If you need a refill on your pain medication, please contact your pharmacy.  They will contact our office to request authorization.  Prescriptions will not be filled after 5pm or on week-ends. You should eat very light the first 24 hours after surgery, such as soup, crackers, pudding, etc.  Resume your normal diet the day after surgery. Most patients will experience some swelling and bruising in the breast.  Ice packs and a good support bra will help.  Swelling and bruising can take several days to resolve.  It is common to experience some constipation if taking pain medication after surgery.  Increasing fluid intake and taking a stool softener will usually help or prevent this problem from occurring.  A mild laxative (Milk of Magnesia or Miralax) should be taken according to package directions if there are no bowel movements after 48 hours. Unless discharge instructions indicate otherwise, you may remove your bandages 24-48 hours after surgery, and you may shower at that time.  You may have steri-strips (small skin tapes) in place directly over the incision.  These strips should be left on the skin for 7-10 days.  If your surgeon used skin glue on the incision, you may shower in 24 hours.  The glue will flake off over the next 2-3 weeks.  Any  sutures or staples will be removed at the office during your follow-up visit. ACTIVITIES:  You may resume regular daily activities (gradually increasing) beginning the next day.  Wearing a good support bra or sports bra minimizes pain and swelling.  You may have sexual intercourse when it is comfortable. You may drive when you no longer are taking prescription pain medication, you can comfortably wear a seatbelt, and you can safely maneuver your car and apply brakes. RETURN TO WORK:  ______________________________________________________________________________________ Dennis Bast should see your doctor in the office for a follow-up appointment approximately two weeks after your surgery.  Your doctor's nurse will typically make your follow-up appointment when she calls you with your pathology report.  Expect your pathology report 2-3 business days after your surgery.  You may call to check if you do not hear from [place] after three days. OTHER INSTRUCTIONS: OK TO REMOVE THE BINDER AND SHOWER STARTING TOMORROW ICE PACK,TYLENOL, AND IBUPROFEN ALSO FOR PAIN NO VIGOROUS ACTIVITY FOR ONE WEEK _______________________________________________________________________________________________ _____________________________________________________________________________________________________________________________________ _____________________________________________________________________________________________________________________________________ _____________________________________________________________________________________________________________________________________  WHEN TO CALL YOUR DOCTOR: Fever over 101.0 Nausea and/or vomiting. Extreme swelling or bruising. Continued bleeding from incision. Increased pain, redness, or drainage from the incision.  The clinic staff is available to answer your questions during regular business hours.  Please don't hesitate to call and ask to speak to one of the  nurses for clinical concerns.  If you have a medical emergency, go to the nearest emergency room or call 911.  A surgeon from [facility] is always on call at the hospital.  For further  questions, please visit [email or web address]     Post Anesthesia Home Care Instructions  Activity: Get plenty of rest for the remainder of the day. A responsible individual must stay with you for 24 hours following the procedure.  For the next 24 hours, DO NOT: -Drive a car -Paediatric nurse -Drink alcoholic beverages -Take any medication unless instructed by your physician -Make any legal decisions or sign important papers.  Meals: Start with liquid foods such as gelatin or soup. Progress to regular foods as tolerated. Avoid greasy, spicy, heavy foods. If nausea and/or vomiting occur, drink only clear liquids until the nausea and/or vomiting subsides. Call your physician if vomiting continues.  Special Instructions/Symptoms: Your throat may feel dry or sore from the anesthesia or the breathing tube placed in your throat during surgery. If this causes discomfort, gargle with warm salt water. The discomfort should disappear within 24 hours.  If you had a scopolamine patch placed behind your ear for the management of post- operative nausea and/or vomiting:  1. The medication in the patch is effective for 72 hours, after which it should be removed.  Wrap patch in a tissue and discard in the trash. Wash hands thoroughly with soap and water. 2. You may remove the patch earlier than 72 hours if you experience unpleasant side effects which may include dry mouth, dizziness or visual disturbances. 3. Avoid touching the patch. Wash your hands with soap and water after contact with the patch.

## 2022-01-13 NOTE — Anesthesia Preprocedure Evaluation (Addendum)
Anesthesia Evaluation  Patient identified by MRN, date of birth, ID band Patient awake    Reviewed: Allergy & Precautions, NPO status , Patient's Chart, lab work & pertinent test results, reviewed documented beta blocker date and time   History of Anesthesia Complications (+) PONV and history of anesthetic complications  Airway Mallampati: II  TM Distance: >3 FB Neck ROM: Full    Dental  (+) Teeth Intact, Dental Advisory Given, Chipped,    Pulmonary neg pulmonary ROS,    Pulmonary exam normal breath sounds clear to auscultation       Cardiovascular hypertension, Pt. on medications Normal cardiovascular exam Rhythm:Regular Rate:Normal     Neuro/Psych PSYCHIATRIC DISORDERS Anxiety Depression negative neurological ROS     GI/Hepatic negative GI ROS, Neg liver ROS,   Endo/Other  Right Breast Ca Hx/o Breast Ca S/P ChemoRx and RT  Hyperlipidemia Obesity  Renal/GU negative Renal ROS  negative genitourinary   Musculoskeletal negative musculoskeletal ROS (+)   Abdominal (+) + obese,   Peds  Hematology negative hematology ROS (+)   Anesthesia Other Findings   Reproductive/Obstetrics                            Anesthesia Physical Anesthesia Plan  ASA: 2  Anesthesia Plan: General   Post-op Pain Management: Regional block*, Minimal or no pain anticipated, Precedex and Tylenol PO (pre-op)*   Induction: Intravenous  PONV Risk Score and Plan: 4 or greater and Treatment may vary due to age or medical condition, Midazolam, Scopolamine patch - Pre-op, Ondansetron and Dexamethasone  Airway Management Planned: LMA  Additional Equipment: None  Intra-op Plan:   Post-operative Plan: Extubation in OR  Informed Consent: I have reviewed the patients History and Physical, chart, labs and discussed the procedure including the risks, benefits and alternatives for the proposed anesthesia with the  patient or authorized representative who has indicated his/her understanding and acceptance.     Dental advisory given  Plan Discussed with: CRNA and Anesthesiologist  Anesthesia Plan Comments:         Anesthesia Quick Evaluation

## 2022-01-14 ENCOUNTER — Encounter (HOSPITAL_BASED_OUTPATIENT_CLINIC_OR_DEPARTMENT_OTHER): Payer: Self-pay | Admitting: Surgery

## 2022-01-18 LAB — SURGICAL PATHOLOGY

## 2022-01-20 ENCOUNTER — Encounter: Payer: Self-pay | Admitting: *Deleted

## 2022-01-25 ENCOUNTER — Telehealth: Payer: Self-pay | Admitting: *Deleted

## 2022-01-25 ENCOUNTER — Encounter (HOSPITAL_COMMUNITY): Payer: Self-pay

## 2022-01-25 NOTE — Telephone Encounter (Signed)
Received documentation from [employer] stating unable to report Oncotype due to " insufficient carcinoma present " in sample.  This RN contacted pathology per above and was told " the nurse navigators usually let us know and they put in an order so our pathologist can look at the sample before we send it again."  This note will be forwarded to the navigators per above.  Pt's follow up appt is schedule for 6/27.

## 2022-01-26 ENCOUNTER — Telehealth: Payer: Self-pay | Admitting: *Deleted

## 2022-01-26 NOTE — Telephone Encounter (Signed)
Request sent to pathology for additional tissue for oncotype to be performed.

## 2022-02-07 ENCOUNTER — Encounter: Payer: Self-pay | Admitting: *Deleted

## 2022-02-10 ENCOUNTER — Telehealth: Payer: Self-pay | Admitting: *Deleted

## 2022-02-10 ENCOUNTER — Telehealth: Payer: Self-pay | Admitting: Radiation Oncology

## 2022-02-10 ENCOUNTER — Encounter: Payer: Self-pay | Admitting: *Deleted

## 2022-02-10 DIAGNOSIS — Z170 Estrogen receptor positive status [ER+]: Secondary | ICD-10-CM

## 2022-02-10 NOTE — Telephone Encounter (Signed)
Called patient to schedule a consultation w. Dr. Squire. No answer, LVM for a return call.  

## 2022-02-10 NOTE — Telephone Encounter (Signed)
Received Oncotype score of 7. Physician team notified. Called pt with results. Left vm informing chemo not recommended and next step is xrt with Dr. Isidore Moos Referral placed.

## 2022-02-14 ENCOUNTER — Telehealth: Payer: Self-pay | Admitting: Radiation Oncology

## 2022-02-14 NOTE — Telephone Encounter (Signed)
Called patient to schedule a consultation w. Dr. Squire. No answer, LVM for a return call.  

## 2022-02-15 ENCOUNTER — Encounter: Payer: Self-pay | Admitting: Hematology and Oncology

## 2022-02-15 ENCOUNTER — Other Ambulatory Visit: Payer: Self-pay

## 2022-02-15 ENCOUNTER — Inpatient Hospital Stay: Payer: Managed Care, Other (non HMO) | Attending: Hematology and Oncology | Admitting: Hematology and Oncology

## 2022-02-15 DIAGNOSIS — Z170 Estrogen receptor positive status [ER+]: Secondary | ICD-10-CM | POA: Insufficient documentation

## 2022-02-15 DIAGNOSIS — Z923 Personal history of irradiation: Secondary | ICD-10-CM | POA: Diagnosis not present

## 2022-02-15 DIAGNOSIS — Z853 Personal history of malignant neoplasm of breast: Secondary | ICD-10-CM | POA: Diagnosis not present

## 2022-02-15 DIAGNOSIS — Z9221 Personal history of antineoplastic chemotherapy: Secondary | ICD-10-CM | POA: Insufficient documentation

## 2022-02-15 DIAGNOSIS — Z79899 Other long term (current) drug therapy: Secondary | ICD-10-CM | POA: Diagnosis not present

## 2022-02-15 DIAGNOSIS — C50411 Malignant neoplasm of upper-outer quadrant of right female breast: Secondary | ICD-10-CM | POA: Diagnosis present

## 2022-02-15 MED ORDER — LETROZOLE 2.5 MG PO TABS: 2.5000 mg | ORAL_TABLET | Freq: Every day | ORAL | 1 refills | Status: DC

## 2022-02-18 ENCOUNTER — Encounter (HOSPITAL_COMMUNITY): Payer: Self-pay

## 2022-02-23 ENCOUNTER — Encounter: Payer: Self-pay | Admitting: *Deleted

## 2022-03-07 NOTE — Progress Notes (Signed)
Location of Breast Cancer:  Malignant neoplasm of upper-outer quadrant of right breast in female, estrogen receptor positive  Histology per Pathology Report:  01/13/2022 A. BREAST, RIGHT, LUMPECTOMY:  - Focal atypical lobular hyperplasia (ALH).  - Biopsy site and biopsy clip.  - No residual complex sclerosing lesion.  B. BREAST, RIGHT, LUMPECTOMY:  - Invasive and in situ ductal carcinoma, 1.4 cm.  - All surgical margins negative for carcinoma.  - Invasive carcinoma 0.4 cm from lateral margin.  - Fibrocystic changes with calcifications.  - Small intraductal papilloma.  - See oncology table.  C. LYMPH NODE, RIGHT AXILLARY, SENTINEL, EXCISION:  - Lymph node negative for metastatic carcinoma (0/1).  D. LYMPH NODE, RIGHT AXILLARY, SENTINEL, EXCISION:  - Lymph node negative for metastatic carcinoma (0/1).  E. LYMPH NODE, RIGHT AXILLARY, SENTINEL, EXCISION:  - Lymph node negative for metastatic carcinoma (0/1).  F. LYMPH NODE, RIGHT AXILLARY, SENTINEL, EXCISION:  - Lymph node negative for metastatic carcinoma (0/1).   Receptor Status: ER(100%), PR (100%), Her2-neu (Negative via [place]), Ki-67(10%)  Past/Anticipated interventions by surgeon, if any:  02/28/2022 Dr. [name] (office visit) --Physical Exam On exam, there is almost no ecchymosis now and is difficult to tell whether this is a hematoma or just a seroma in the breast. After discussing this with her she consented to an aspiration. I aspirated 35 cc of seroma fluid/hematoma --Assessment and Plan:   She is doing well postoperatively. I do not believe she will need any further fluid aspirated from the right breast and she can go ahead and start radiation therapy. I will plan on her back in 3 months unless there are problems. --Return in about 3 months (around 05/31/2022).  01/13/2022 --Dr. [name] RIGHT BREAST LUMPECTOMY WITH RADIOACTIVE SEED X 2 AND DEEP AXILLARY SENTINEL LYMPH NODE BIOPSY INJECTION OF MAGTRACE  FOR LYMPH NODE MAPPING  Past/Anticipated interventions by medical oncology, if any:  Under care of Dr. Arletha Pili Iruku 02/15/2022 Given small tumor with favorable prognostics we have discussed about upfront surgery followed by Oncotype testing.  Oncotype resulted at 7, distant risk of recurrence at 9 years with antiestrogen therapy alone is 3%, no benefit from chemotherapy She underwent right partial mastectomy on 01/13/2022, final pathology showing 1.4 cm invasive ductal carcinoma, all surgical margins negative, no evidence of lymph node involvement.    She will follow-up with Dr. Isidore Moos for consideration of adjuvant radiation.   She has tried tamoxifen as well as Femara in the past    She is willing to restart Femara after completion of radiation.   We will continue to monitor her bone density. Given her CHEK2 mutation, we have discussed about role of mammograms as well as MRI screening.   We have discussed unknown long-term risk of gadolinium deposition with MRI, she understands that she may need more biopsies with MRIs because of high sensitivity.   She will think about this. She will return to clinic for follow-up with [place] in November.  Lymphedema issues, if any:  Patient denies    Pain issues, if any:  Patient denies   SAFETY ISSUES: Prior radiation? Yes--left breast, completed 2008 Pacemaker/ICD? No Possible current pregnancy? No--postmenopausal Is the patient on methotrexate? No  Current Complaints / other details:  Nothing else of note

## 2022-03-07 NOTE — Progress Notes (Signed)
Radiation Oncology         ([phone]) [phone] ________________________________  Name: Marilyn Hansen MRN: [id number]  Date: 03/08/2022  DOB: 02/19/1956  Follow-Up Visit Note  Outpatient  CC: Kathyrn Lass, MD  Benay Pike, MD  Diagnosis:   No diagnosis found.   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, ER+ / PR+ / Her2-, Grade 2   Cancer Staging  Breast cancer of upper-outer quadrant of left female breast ([place]) Staging form: Breast, AJCC 7th Edition - Clinical: No stage assigned - Unsigned - Pathologic: Stage IIA (T2, N0, cM0) - Signed by Minette Headland, NP on 01/16/2014  Malignant neoplasm of upper-outer quadrant of right breast in female, estrogen receptor positive ([place]) Staging form: Breast, AJCC 8th Edition - Clinical stage from 12/08/2021: Stage IA (cT1b, cN0, cM0, G2, ER+, PR+, HER2-) - Signed by Benay Pike, MD on 12/08/2021   CHIEF COMPLAINT: Here to discuss management of right breast cancer  Narrative:  The patient returns today for follow-up.     Since breast clinic consultation date of 12/08/21, the patient opted to proceed with a right lumpectomy with nodal biopsies on 01/13/22 under the care of Dr. Ninfa Linden. Pathology from the procedure revealed: tumor size of 1.4 cm; histology of invasive and in-situ ductal carcinoma with a small intraductal papilloma; all margins negative for both invasive and in-situ carcinoma; margin status to invasive disease of 0.4 cm from the lateral margin; margin status to in situ disease of 0.8 cm from the lateral margin; nodal status of 4/4 right axillary sentinel lymph nodes excisions negative for  metastatic carcinoma;  ER status: 100% positive; PR status 100% positive, both with strong staining intensity, Her2 status negative; Proliferation marker Ki67 at 10%; Grade 2.  Oncotype DX was obtained on the final surgical sample and the recurrence score of 7 predicts a risk of recurrence outside the breast over the next 9 years  of 3%, if the patient's only systemic therapy is an antiestrogen for 5 years.  It also predicts no significant benefit from chemotherapy.   Additional right breast lumpectomy revealed focal atypical lobular hyperplasia without evidence of malignancy.   The patient has met with Dr. Chryl Heck and has agreed to proceed with femara following completion of XRT.   Symptomatically, the patient reports: ***  PREVIOUS RADIATION THERAPY: Yes, for left breast invasive ductal carcinoma T2, N1, M0 stage IIB ER/PR positive HER-2 negative; BRCA1 and 2 negative; Oncotype DX intermediate risk; received 4 cycles of Taxotere and Cytoxan followed by radiation that was completed in 2009; and started on tamoxifen for 2 years and then letrozole 2.5 mg daily. Completed 10 years of letrozole therapy by November 2019        ALLERGIES:  is allergic to dayquil [pseudoephedrine-apap-dm] and lisinopril.  Meds: Current Outpatient Medications  Medication Sig Dispense Refill   Ascorbic Acid (VITAMIN C) 1000 MG tablet Take 1,000 mg by mouth daily.     atorvastatin (LIPITOR) 40 MG tablet Take 40 mg by mouth daily.     Cholecalciferol (VITAMIN D3) 1.25 MG (50000 UT) CAPS Take by mouth.     escitalopram (LEXAPRO) 10 MG tablet 10 mg daily.     hydrochlorothiazide (HYDRODIURIL) 25 MG tablet Take 25 mg by mouth daily.      letrozole (FEMARA) 2.5 MG tablet Take 1 tablet (2.5 mg total) by mouth daily. 90 tablet 1   traMADol (ULTRAM) 50 MG tablet Take 1 tablet (50 mg total) by mouth every 6 (six) hours as needed  for moderate pain or severe pain. 25 tablet 0   traZODone (DESYREL) 50 MG tablet Take 1 tablet (50 mg total) by mouth at bedtime as needed. 90 tablet 4   zinc gluconate 50 MG tablet Take 50 mg by mouth daily.     No current facility-administered medications for this encounter.    Physical Findings:  vitals were not taken for this visit. .     General: Alert and oriented, in no acute distress HEENT: Head is normocephalic.  Extraocular movements are intact. Oropharynx is clear. Neck: Neck is supple, no palpable cervical or supraclavicular lymphadenopathy. Heart: Regular in rate and rhythm with no murmurs, rubs, or gallops. Chest: Clear to auscultation bilaterally, with no rhonchi, wheezes, or rales. Abdomen: Soft, nontender, nondistended, with no rigidity or guarding. Extremities: No cyanosis or edema. Lymphatics: see Neck Exam Musculoskeletal: symmetric strength and muscle tone throughout. Neurologic: No obvious focalities. Speech is fluent.  Psychiatric: Judgment and insight are intact. Affect is appropriate. Breast exam reveals ***  Lab Findings: Lab Results  Component Value Date   WBC 6.1 12/08/2021   HGB 13.3 12/08/2021   HCT 39.0 12/08/2021   MCV 89.4 12/08/2021   PLT 269 12/08/2021    '@LASTCHEMISTRY' @  Radiographic Findings: No results found.  Impression/Plan: We discussed adjuvant radiotherapy today.  I recommend *** in order to ***.  I reviewed the logistics, benefits, risks, and potential side effects of this treatment in detail. Risks may include but not necessary be limited to acute and late injury tissue in the radiation fields such as skin irritation (change in color/pigmentation, itching, dryness, pain, peeling). She may experience fatigue. We also discussed possible risk of long term cosmetic changes or scar tissue. There is also a smaller risk for lung toxicity, ***cardiac toxicity, ***brachial plexopathy, ***lymphedema, ***musculoskeletal changes, ***rib fragility or ***induction of a second malignancy, ***late chronic non-healing soft tissue wound.    The patient asked good questions which I answered to her satisfaction. She is enthusiastic about proceeding with treatment. A consent form has been *** signed and placed in her chart.  A total of *** medically necessary complex treatment devices will be fabricated and supervised by me: *** fields with MLCs for custom blocks to protect  heart, and lungs;  and, a Vac-lok. MORE COMPLEX DEVICES MAY BE MADE IN DOSIMETRY FOR FIELD IN FIELD BEAMS FOR DOSE HOMOGENEITY.  I have requested : 3D Simulation which is medically necessary to give adequate dose to at risk tissues while sparing lungs and heart.  I have requested a DVH of the following structures: lungs, heart, *** lumpectomy cavity.    The patient will receive *** Gy in *** fractions to the *** with *** fields.  This will be *** followed by a boost.  On date of service, in total, I spent *** minutes on this encounter. Patient was seen in person.  _____________________________________   Eppie Gibson, MD  This document serves as a record of services personally performed by Eppie Gibson, MD. It was created on her behalf by Roney Mans, a trained medical scribe. The creation of this record is based on the scribe's personal observations and the provider's statements to them. This document has been checked and approved by the attending provider.

## 2022-03-08 ENCOUNTER — Ambulatory Visit
Admission: RE | Admit: 2022-03-08 | Discharge: 2022-03-08 | Disposition: A | Discharge status: Home / Self Care | Payer: Managed Care, Other (non HMO) | Source: Ambulatory Visit | Attending: Radiation Oncology | Admitting: Radiation Oncology

## 2022-03-08 ENCOUNTER — Encounter: Payer: Self-pay | Admitting: Radiation Oncology

## 2022-03-08 ENCOUNTER — Other Ambulatory Visit: Payer: Self-pay

## 2022-03-08 VITALS — BP 140/79 | HR 96 | Temp 97.2°F | Resp 20 | Wt 193.4 lb

## 2022-03-08 DIAGNOSIS — C50411 Malignant neoplasm of upper-outer quadrant of right female breast: Secondary | ICD-10-CM | POA: Insufficient documentation

## 2022-03-08 DIAGNOSIS — Z79811 Long term (current) use of aromatase inhibitors: Secondary | ICD-10-CM | POA: Insufficient documentation

## 2022-03-08 DIAGNOSIS — Z170 Estrogen receptor positive status [ER+]: Secondary | ICD-10-CM

## 2022-03-08 DIAGNOSIS — Z79899 Other long term (current) drug therapy: Secondary | ICD-10-CM | POA: Insufficient documentation

## 2022-03-08 DIAGNOSIS — Z510 Encounter for antineoplastic radiation therapy: Secondary | ICD-10-CM | POA: Insufficient documentation

## 2022-03-15 ENCOUNTER — Encounter: Payer: Self-pay | Admitting: *Deleted

## 2022-03-18 DIAGNOSIS — Z510 Encounter for antineoplastic radiation therapy: Secondary | ICD-10-CM | POA: Diagnosis not present

## 2022-03-21 ENCOUNTER — Other Ambulatory Visit: Payer: Self-pay

## 2022-03-21 ENCOUNTER — Ambulatory Visit: Payer: Managed Care, Other (non HMO)

## 2022-03-21 ENCOUNTER — Ambulatory Visit
Admission: RE | Admit: 2022-03-21 | Discharge: 2022-03-21 | Disposition: A | Discharge status: Home / Self Care | Payer: Managed Care, Other (non HMO) | Source: Ambulatory Visit | Attending: Radiation Oncology | Admitting: Radiation Oncology

## 2022-03-21 DIAGNOSIS — C50412 Malignant neoplasm of upper-outer quadrant of left female breast: Secondary | ICD-10-CM

## 2022-03-21 DIAGNOSIS — Z510 Encounter for antineoplastic radiation therapy: Secondary | ICD-10-CM | POA: Diagnosis not present

## 2022-03-21 LAB — RAD ONC ARIA SESSION SUMMARY
Course Elapsed Days: 0
Plan Fractions Treated to Date: 1
Plan Prescribed Dose Per Fraction: 2.67 Gy
Plan Total Fractions Prescribed: 15
Plan Total Prescribed Dose: 40.05 Gy
Reference Point Dosage Given to Date: 2.67 Gy
Reference Point Session Dosage Given: 2.67 Gy
Session Number: 1

## 2022-03-21 MED ORDER — RADIAPLEXRX EX GEL: Freq: Once | CUTANEOUS | Status: AC

## 2022-03-21 MED ORDER — ALRA NON-METALLIC DEODORANT (RAD-ONC)
1.0000 | Freq: Once | TOPICAL | Status: AC
  Administered 2022-03-21: 1 via TOPICAL

## 2022-03-21 NOTE — Progress Notes (Signed)

## 2022-03-22 ENCOUNTER — Other Ambulatory Visit: Payer: Self-pay

## 2022-03-22 ENCOUNTER — Ambulatory Visit
Admission: RE | Admit: 2022-03-22 | Discharge: 2022-03-22 | Disposition: A | Discharge status: Home / Self Care | Payer: Managed Care, Other (non HMO) | Source: Ambulatory Visit | Attending: Radiation Oncology | Admitting: Radiation Oncology

## 2022-03-22 DIAGNOSIS — C50411 Malignant neoplasm of upper-outer quadrant of right female breast: Secondary | ICD-10-CM | POA: Insufficient documentation

## 2022-03-22 DIAGNOSIS — Z170 Estrogen receptor positive status [ER+]: Secondary | ICD-10-CM | POA: Insufficient documentation

## 2022-03-22 DIAGNOSIS — Z510 Encounter for antineoplastic radiation therapy: Secondary | ICD-10-CM | POA: Insufficient documentation

## 2022-03-22 LAB — RAD ONC ARIA SESSION SUMMARY
Course Elapsed Days: 1
Plan Fractions Treated to Date: 2
Plan Prescribed Dose Per Fraction: 2.67 Gy
Plan Total Fractions Prescribed: 15
Plan Total Prescribed Dose: 40.05 Gy
Reference Point Dosage Given to Date: 5.34 Gy
Reference Point Session Dosage Given: 2.67 Gy
Session Number: 2

## 2022-03-23 ENCOUNTER — Ambulatory Visit
Admission: RE | Admit: 2022-03-23 | Discharge: 2022-03-23 | Disposition: A | Discharge status: Home / Self Care | Payer: Managed Care, Other (non HMO) | Source: Ambulatory Visit | Attending: Radiation Oncology | Admitting: Radiation Oncology

## 2022-03-23 ENCOUNTER — Other Ambulatory Visit: Payer: Self-pay

## 2022-03-23 DIAGNOSIS — Z510 Encounter for antineoplastic radiation therapy: Secondary | ICD-10-CM | POA: Diagnosis not present

## 2022-03-23 LAB — RAD ONC ARIA SESSION SUMMARY
Course Elapsed Days: 2
Plan Fractions Treated to Date: 3
Plan Prescribed Dose Per Fraction: 2.67 Gy
Plan Total Fractions Prescribed: 15
Plan Total Prescribed Dose: 40.05 Gy
Reference Point Dosage Given to Date: 8.01 Gy
Reference Point Session Dosage Given: 2.67 Gy
Session Number: 3

## 2022-03-24 ENCOUNTER — Other Ambulatory Visit: Payer: Self-pay

## 2022-03-24 ENCOUNTER — Ambulatory Visit
Admission: RE | Admit: 2022-03-24 | Discharge: 2022-03-24 | Disposition: A | Discharge status: Home / Self Care | Payer: Managed Care, Other (non HMO) | Source: Ambulatory Visit | Attending: Radiation Oncology | Admitting: Radiation Oncology

## 2022-03-24 DIAGNOSIS — Z510 Encounter for antineoplastic radiation therapy: Secondary | ICD-10-CM | POA: Diagnosis not present

## 2022-03-24 LAB — RAD ONC ARIA SESSION SUMMARY
Course Elapsed Days: 3
Plan Fractions Treated to Date: 4
Plan Prescribed Dose Per Fraction: 2.67 Gy
Plan Total Fractions Prescribed: 15
Plan Total Prescribed Dose: 40.05 Gy
Reference Point Dosage Given to Date: 10.68 Gy
Reference Point Session Dosage Given: 2.67 Gy
Session Number: 4

## 2022-03-25 ENCOUNTER — Ambulatory Visit
Admission: RE | Admit: 2022-03-25 | Discharge: 2022-03-25 | Disposition: A | Discharge status: Home / Self Care | Payer: Managed Care, Other (non HMO) | Source: Ambulatory Visit | Attending: Radiation Oncology

## 2022-03-25 ENCOUNTER — Other Ambulatory Visit: Payer: Self-pay

## 2022-03-25 DIAGNOSIS — Z510 Encounter for antineoplastic radiation therapy: Secondary | ICD-10-CM | POA: Diagnosis not present

## 2022-03-25 LAB — RAD ONC ARIA SESSION SUMMARY
Course Elapsed Days: 4
Plan Fractions Treated to Date: 5
Plan Prescribed Dose Per Fraction: 2.67 Gy
Plan Total Fractions Prescribed: 15
Plan Total Prescribed Dose: 40.05 Gy
Reference Point Dosage Given to Date: 13.35 Gy
Reference Point Session Dosage Given: 2.67 Gy
Session Number: 5

## 2022-03-28 ENCOUNTER — Other Ambulatory Visit: Payer: Self-pay

## 2022-03-28 ENCOUNTER — Ambulatory Visit
Admission: RE | Admit: 2022-03-28 | Discharge: 2022-03-28 | Disposition: A | Discharge status: Home / Self Care | Payer: Managed Care, Other (non HMO) | Source: Ambulatory Visit | Attending: Radiation Oncology

## 2022-03-28 DIAGNOSIS — Z510 Encounter for antineoplastic radiation therapy: Secondary | ICD-10-CM | POA: Diagnosis not present

## 2022-03-28 LAB — RAD ONC ARIA SESSION SUMMARY
Course Elapsed Days: 7
Plan Fractions Treated to Date: 6
Plan Prescribed Dose Per Fraction: 2.67 Gy
Plan Total Fractions Prescribed: 15
Plan Total Prescribed Dose: 40.05 Gy
Reference Point Dosage Given to Date: 16.02 Gy
Reference Point Session Dosage Given: 2.67 Gy
Session Number: 6

## 2022-03-29 ENCOUNTER — Ambulatory Visit: Payer: Managed Care, Other (non HMO)

## 2022-03-29 ENCOUNTER — Ambulatory Visit
Admission: RE | Admit: 2022-03-29 | Discharge: 2022-03-29 | Disposition: A | Discharge status: Home / Self Care | Payer: Managed Care, Other (non HMO) | Source: Ambulatory Visit | Attending: Radiation Oncology | Admitting: Radiation Oncology

## 2022-03-29 ENCOUNTER — Other Ambulatory Visit: Payer: Self-pay

## 2022-03-29 DIAGNOSIS — Z510 Encounter for antineoplastic radiation therapy: Secondary | ICD-10-CM | POA: Diagnosis not present

## 2022-03-29 LAB — RAD ONC ARIA SESSION SUMMARY
Course Elapsed Days: 8
Plan Fractions Treated to Date: 7
Plan Prescribed Dose Per Fraction: 2.67 Gy
Plan Total Fractions Prescribed: 15
Plan Total Prescribed Dose: 40.05 Gy
Reference Point Dosage Given to Date: 18.69 Gy
Reference Point Session Dosage Given: 2.67 Gy
Session Number: 7

## 2022-03-30 ENCOUNTER — Other Ambulatory Visit: Payer: Self-pay

## 2022-03-30 ENCOUNTER — Ambulatory Visit
Admission: RE | Admit: 2022-03-30 | Discharge: 2022-03-30 | Disposition: A | Discharge status: Home / Self Care | Payer: Managed Care, Other (non HMO) | Source: Ambulatory Visit | Attending: Radiation Oncology

## 2022-03-30 DIAGNOSIS — Z510 Encounter for antineoplastic radiation therapy: Secondary | ICD-10-CM | POA: Diagnosis not present

## 2022-03-30 LAB — RAD ONC ARIA SESSION SUMMARY
Course Elapsed Days: 9
Plan Fractions Treated to Date: 8
Plan Prescribed Dose Per Fraction: 2.67 Gy
Plan Total Fractions Prescribed: 15
Plan Total Prescribed Dose: 40.05 Gy
Reference Point Dosage Given to Date: 21.36 Gy
Reference Point Session Dosage Given: 2.67 Gy
Session Number: 8

## 2022-03-31 ENCOUNTER — Ambulatory Visit
Admission: RE | Admit: 2022-03-31 | Discharge: 2022-03-31 | Disposition: A | Discharge status: Home / Self Care | Payer: Managed Care, Other (non HMO) | Source: Ambulatory Visit | Attending: Radiation Oncology | Admitting: Radiation Oncology

## 2022-03-31 ENCOUNTER — Encounter (HOSPITAL_COMMUNITY): Payer: Self-pay

## 2022-03-31 ENCOUNTER — Other Ambulatory Visit: Payer: Self-pay

## 2022-03-31 DIAGNOSIS — Z510 Encounter for antineoplastic radiation therapy: Secondary | ICD-10-CM | POA: Diagnosis not present

## 2022-03-31 LAB — RAD ONC ARIA SESSION SUMMARY
Course Elapsed Days: 10
Plan Fractions Treated to Date: 9
Plan Prescribed Dose Per Fraction: 2.67 Gy
Plan Total Fractions Prescribed: 15
Plan Total Prescribed Dose: 40.05 Gy
Reference Point Dosage Given to Date: 24.03 Gy
Reference Point Session Dosage Given: 2.67 Gy
Session Number: 9

## 2022-04-01 ENCOUNTER — Ambulatory Visit
Admission: RE | Admit: 2022-04-01 | Discharge: 2022-04-01 | Disposition: A | Discharge status: Home / Self Care | Payer: Managed Care, Other (non HMO) | Source: Ambulatory Visit | Attending: Radiation Oncology | Admitting: Radiation Oncology

## 2022-04-01 ENCOUNTER — Other Ambulatory Visit: Payer: Self-pay

## 2022-04-01 DIAGNOSIS — Z510 Encounter for antineoplastic radiation therapy: Secondary | ICD-10-CM | POA: Diagnosis not present

## 2022-04-01 LAB — RAD ONC ARIA SESSION SUMMARY
Course Elapsed Days: 11
Plan Fractions Treated to Date: 10
Plan Prescribed Dose Per Fraction: 2.67 Gy
Plan Total Fractions Prescribed: 15
Plan Total Prescribed Dose: 40.05 Gy
Reference Point Dosage Given to Date: 26.7 Gy
Reference Point Session Dosage Given: 2.67 Gy
Session Number: 10

## 2022-04-04 ENCOUNTER — Other Ambulatory Visit: Payer: Self-pay

## 2022-04-04 ENCOUNTER — Ambulatory Visit
Admission: RE | Admit: 2022-04-04 | Discharge: 2022-04-04 | Disposition: A | Discharge status: Home / Self Care | Payer: Managed Care, Other (non HMO) | Source: Ambulatory Visit | Attending: Radiation Oncology

## 2022-04-04 ENCOUNTER — Ambulatory Visit: Payer: Managed Care, Other (non HMO)

## 2022-04-04 DIAGNOSIS — Z510 Encounter for antineoplastic radiation therapy: Secondary | ICD-10-CM | POA: Diagnosis not present

## 2022-04-04 LAB — RAD ONC ARIA SESSION SUMMARY
Course Elapsed Days: 14
Plan Fractions Treated to Date: 11
Plan Prescribed Dose Per Fraction: 2.67 Gy
Plan Total Fractions Prescribed: 15
Plan Total Prescribed Dose: 40.05 Gy
Reference Point Dosage Given to Date: 29.37 Gy
Reference Point Session Dosage Given: 2.67 Gy
Session Number: 11

## 2022-04-05 ENCOUNTER — Ambulatory Visit
Admission: RE | Admit: 2022-04-05 | Discharge: 2022-04-05 | Disposition: A | Discharge status: Home / Self Care | Payer: Managed Care, Other (non HMO) | Source: Ambulatory Visit | Attending: Radiation Oncology

## 2022-04-05 ENCOUNTER — Other Ambulatory Visit: Payer: Self-pay

## 2022-04-05 DIAGNOSIS — Z510 Encounter for antineoplastic radiation therapy: Secondary | ICD-10-CM | POA: Diagnosis not present

## 2022-04-05 LAB — RAD ONC ARIA SESSION SUMMARY
Course Elapsed Days: 15
Plan Fractions Treated to Date: 12
Plan Prescribed Dose Per Fraction: 2.67 Gy
Plan Total Fractions Prescribed: 15
Plan Total Prescribed Dose: 40.05 Gy
Reference Point Dosage Given to Date: 32.04 Gy
Reference Point Session Dosage Given: 2.67 Gy
Session Number: 12

## 2022-04-06 ENCOUNTER — Other Ambulatory Visit: Payer: Self-pay

## 2022-04-06 ENCOUNTER — Ambulatory Visit
Admission: RE | Admit: 2022-04-06 | Discharge: 2022-04-06 | Disposition: A | Discharge status: Home / Self Care | Payer: Managed Care, Other (non HMO) | Source: Ambulatory Visit | Attending: Radiation Oncology

## 2022-04-06 DIAGNOSIS — Z510 Encounter for antineoplastic radiation therapy: Secondary | ICD-10-CM | POA: Diagnosis not present

## 2022-04-06 LAB — RAD ONC ARIA SESSION SUMMARY
Course Elapsed Days: 16
Plan Fractions Treated to Date: 13
Plan Prescribed Dose Per Fraction: 2.67 Gy
Plan Total Fractions Prescribed: 15
Plan Total Prescribed Dose: 40.05 Gy
Reference Point Dosage Given to Date: 34.71 Gy
Reference Point Session Dosage Given: 2.67 Gy
Session Number: 13

## 2022-04-07 ENCOUNTER — Ambulatory Visit
Admission: RE | Admit: 2022-04-07 | Discharge: 2022-04-07 | Disposition: A | Discharge status: Home / Self Care | Payer: Managed Care, Other (non HMO) | Source: Ambulatory Visit | Attending: Radiation Oncology | Admitting: Radiation Oncology

## 2022-04-07 ENCOUNTER — Other Ambulatory Visit: Payer: Self-pay

## 2022-04-07 DIAGNOSIS — Z510 Encounter for antineoplastic radiation therapy: Secondary | ICD-10-CM | POA: Diagnosis not present

## 2022-04-07 LAB — RAD ONC ARIA SESSION SUMMARY
Course Elapsed Days: 17
Plan Fractions Treated to Date: 14
Plan Prescribed Dose Per Fraction: 2.67 Gy
Plan Total Fractions Prescribed: 15
Plan Total Prescribed Dose: 40.05 Gy
Reference Point Dosage Given to Date: 37.38 Gy
Reference Point Session Dosage Given: 2.67 Gy
Session Number: 14

## 2022-04-08 ENCOUNTER — Ambulatory Visit: Payer: Managed Care, Other (non HMO)

## 2022-04-11 ENCOUNTER — Ambulatory Visit
Admission: RE | Admit: 2022-04-11 | Discharge: 2022-04-11 | Disposition: A | Discharge status: Home / Self Care | Payer: Managed Care, Other (non HMO) | Source: Ambulatory Visit | Attending: Radiation Oncology | Admitting: Radiation Oncology

## 2022-04-11 ENCOUNTER — Other Ambulatory Visit: Payer: Self-pay

## 2022-04-11 ENCOUNTER — Ambulatory Visit: Payer: Managed Care, Other (non HMO)

## 2022-04-11 DIAGNOSIS — Z510 Encounter for antineoplastic radiation therapy: Secondary | ICD-10-CM | POA: Diagnosis not present

## 2022-04-11 LAB — RAD ONC ARIA SESSION SUMMARY
Course Elapsed Days: 21
Plan Fractions Treated to Date: 15
Plan Prescribed Dose Per Fraction: 2.67 Gy
Plan Total Fractions Prescribed: 15
Plan Total Prescribed Dose: 40.05 Gy
Reference Point Dosage Given to Date: 40.05 Gy
Reference Point Session Dosage Given: 2.67 Gy
Session Number: 15

## 2022-04-12 ENCOUNTER — Ambulatory Visit
Admission: RE | Admit: 2022-04-12 | Discharge: 2022-04-12 | Disposition: A | Discharge status: Home / Self Care | Payer: Managed Care, Other (non HMO) | Source: Ambulatory Visit | Attending: Radiation Oncology | Admitting: Radiation Oncology

## 2022-04-12 ENCOUNTER — Other Ambulatory Visit: Payer: Self-pay

## 2022-04-12 DIAGNOSIS — Z510 Encounter for antineoplastic radiation therapy: Secondary | ICD-10-CM | POA: Diagnosis not present

## 2022-04-12 LAB — RAD ONC ARIA SESSION SUMMARY
Course Elapsed Days: 22
Plan Fractions Treated to Date: 1
Plan Prescribed Dose Per Fraction: 2 Gy
Plan Total Fractions Prescribed: 5
Plan Total Prescribed Dose: 10 Gy
Reference Point Dosage Given to Date: 42.05 Gy
Reference Point Session Dosage Given: 2 Gy
Session Number: 16

## 2022-04-13 ENCOUNTER — Ambulatory Visit
Admission: RE | Admit: 2022-04-13 | Discharge: 2022-04-13 | Disposition: A | Discharge status: Home / Self Care | Payer: Managed Care, Other (non HMO) | Source: Ambulatory Visit | Attending: Radiation Oncology | Admitting: Radiation Oncology

## 2022-04-13 ENCOUNTER — Other Ambulatory Visit: Payer: Self-pay

## 2022-04-13 DIAGNOSIS — Z510 Encounter for antineoplastic radiation therapy: Secondary | ICD-10-CM | POA: Diagnosis not present

## 2022-04-13 LAB — RAD ONC ARIA SESSION SUMMARY
Course Elapsed Days: 23
Plan Fractions Treated to Date: 2
Plan Prescribed Dose Per Fraction: 2 Gy
Plan Total Fractions Prescribed: 5
Plan Total Prescribed Dose: 10 Gy
Reference Point Dosage Given to Date: 44.05 Gy
Reference Point Session Dosage Given: 2 Gy
Session Number: 17

## 2022-04-14 ENCOUNTER — Other Ambulatory Visit: Payer: Self-pay

## 2022-04-14 ENCOUNTER — Ambulatory Visit: Admission: RE | Admit: 2022-04-14 | Payer: Managed Care, Other (non HMO) | Source: Ambulatory Visit

## 2022-04-14 ENCOUNTER — Ambulatory Visit
Admission: RE | Admit: 2022-04-14 | Discharge: 2022-04-14 | Disposition: A | Discharge status: Home / Self Care | Payer: Managed Care, Other (non HMO) | Source: Ambulatory Visit | Attending: Radiation Oncology | Admitting: Radiation Oncology

## 2022-04-14 DIAGNOSIS — Z510 Encounter for antineoplastic radiation therapy: Secondary | ICD-10-CM | POA: Diagnosis not present

## 2022-04-14 LAB — RAD ONC ARIA SESSION SUMMARY
Course Elapsed Days: 24
Plan Fractions Treated to Date: 3
Plan Prescribed Dose Per Fraction: 2 Gy
Plan Total Fractions Prescribed: 5
Plan Total Prescribed Dose: 10 Gy
Reference Point Dosage Given to Date: 46.05 Gy
Reference Point Session Dosage Given: 2 Gy
Session Number: 18

## 2022-04-15 ENCOUNTER — Other Ambulatory Visit: Payer: Self-pay

## 2022-04-15 ENCOUNTER — Ambulatory Visit
Admission: RE | Admit: 2022-04-15 | Discharge: 2022-04-15 | Disposition: A | Discharge status: Home / Self Care | Payer: Managed Care, Other (non HMO) | Source: Ambulatory Visit | Attending: Radiation Oncology

## 2022-04-15 ENCOUNTER — Encounter: Payer: Self-pay | Admitting: *Deleted

## 2022-04-15 ENCOUNTER — Ambulatory Visit: Payer: Managed Care, Other (non HMO)

## 2022-04-15 DIAGNOSIS — Z170 Estrogen receptor positive status [ER+]: Secondary | ICD-10-CM

## 2022-04-15 DIAGNOSIS — Z510 Encounter for antineoplastic radiation therapy: Secondary | ICD-10-CM | POA: Diagnosis not present

## 2022-04-15 LAB — RAD ONC ARIA SESSION SUMMARY
Course Elapsed Days: 25
Plan Fractions Treated to Date: 4
Plan Prescribed Dose Per Fraction: 2 Gy
Plan Total Fractions Prescribed: 5
Plan Total Prescribed Dose: 10 Gy
Reference Point Dosage Given to Date: 48.05 Gy
Reference Point Session Dosage Given: 2 Gy
Session Number: 19

## 2022-04-18 ENCOUNTER — Ambulatory Visit
Admission: RE | Admit: 2022-04-18 | Discharge: 2022-04-18 | Disposition: A | Discharge status: Home / Self Care | Payer: Managed Care, Other (non HMO) | Source: Ambulatory Visit | Attending: Radiation Oncology | Admitting: Radiation Oncology

## 2022-04-18 ENCOUNTER — Other Ambulatory Visit: Payer: Self-pay

## 2022-04-18 ENCOUNTER — Encounter: Payer: Self-pay | Admitting: Radiation Oncology

## 2022-04-18 ENCOUNTER — Ambulatory Visit: Payer: Managed Care, Other (non HMO)

## 2022-04-18 DIAGNOSIS — Z510 Encounter for antineoplastic radiation therapy: Secondary | ICD-10-CM | POA: Diagnosis not present

## 2022-04-18 LAB — RAD ONC ARIA SESSION SUMMARY
Course Elapsed Days: 28
Plan Fractions Treated to Date: 5
Plan Prescribed Dose Per Fraction: 2 Gy
Plan Total Fractions Prescribed: 5
Plan Total Prescribed Dose: 10 Gy
Reference Point Dosage Given to Date: 50.05 Gy
Reference Point Session Dosage Given: 2 Gy
Session Number: 20

## 2022-05-03 NOTE — Progress Notes (Signed)
                                                                                                                                                             Patient Name: Marilyn Hansen MRN: [id number] DOB: [birth year] Referring Physician: Kathyrn Lass (Profile Not Attached) Date of Service: 04/18/2022  [facility] [place]                                                        End Of Treatment Note  Diagnoses: C50.411-Malignant neoplasm of upper-outer quadrant of right female breast  Cancer Staging:  Cancer Staging  Breast cancer of upper-outer quadrant of left female breast [facility]) Staging form: Breast, AJCC 7th Edition - Clinical: No stage assigned - Unsigned Laterality: Left Tumor size (mm): 26 - Pathologic: Stage IIA (T2, N0, cM0) - Signed by Minette Headland, NP on 01/16/2014 Laterality: Left Tumor size (mm): 26  Malignant neoplasm of upper-outer quadrant of right breast in female, estrogen receptor positive ([place]) Staging form: Breast, AJCC 8th Edition - Clinical stage from 12/08/2021: Stage IA (cT1b, cN0, cM0, G2, ER+, PR+, HER2-) - Signed by Benay Pike, MD on 12/08/2021 Stage prefix: Initial diagnosis Histologic grading system: 3 grade system    Intent: Curative  Radiation Treatment Dates: 03/21/2022 through 04/18/2022 Site Technique Total Dose (Gy) Dose per Fx (Gy) Completed Fx Beam Energies  Breast, Right: Breast_R 3D 40.05/40.05 2.67 15/15 10XFFF  Breast, Right: Breast_R_Bst 3D 10/10 2 5/5 6X, 10X   Narrative: The patient tolerated radiation therapy relatively well.   Plan: The patient will follow-up with radiation oncology in 60mo .   -----------------------------------  Eppie Gibson, MD

## 2022-05-20 ENCOUNTER — Telehealth: Payer: Self-pay

## 2022-05-20 ENCOUNTER — Ambulatory Visit: Payer: Self-pay | Admitting: Radiation Oncology

## 2022-05-20 NOTE — Telephone Encounter (Signed)
I called the patient today about her upcoming follow-up appointment in radiation oncology.   Given the state of the COVID-19 pandemic, concerning case numbers in our community, and guidance from [facility], I offered a phone assessment with the patient to determine if coming to the clinic was necessary. She accepted.  The patient denies any symptomatic concerns. She reports her fatigue and energy levels are much improved. She denies any lingering pain or range of motion concerns to her right breast and right shoulder/arm respectively. She confirms she has started her letrozole and is tolerating well so far. Specifically, she reports good healing of her skin in the radiation fields.  She saw her dermatologist a few days after she finished radiation and was started on kenalog cream which she said resolved her skin changes from radiation. Skin is intact and has returned to her baseline color/texture. I recommended that she continue skin care by applying oil or lotion with vitamin E to the skin in the radiation fields, BID, for 2 more months.         Continue follow-up with medical oncology - follow-up is scheduled on 07/05/2022 with Dr. Chryl Heck.  I explained that yearly mammograms are important for patients with intact breast tissue, and physical exams are important after mastectomy for patients that cannot undergo mammography. She voiced concern about having a mammogram done next January since she continues to have a hard knot under her lumpectomy incision. I encouraged patient to reach out to her surgeon Dr. Ninfa Linden, and assured her I would relay her concern to Dr. Chryl Heck.   I encouraged her to call if she had further questions or concerns about her healing. Otherwise, she will follow-up PRN in radiation oncology. Patient is pleased with this plan, and we will cancel her upcoming follow-up to reduce the risk of COVID-19 transmission.

## 2022-05-23 ENCOUNTER — Encounter: Payer: Self-pay | Admitting: *Deleted

## 2022-06-28 ENCOUNTER — Ambulatory Visit
Admission: RE | Admit: 2022-06-28 | Discharge: 2022-06-28 | Disposition: A | Discharge status: Home / Self Care | Payer: Managed Care, Other (non HMO) | Source: Ambulatory Visit | Attending: Obstetrics & Gynecology | Admitting: Obstetrics & Gynecology

## 2022-06-28 DIAGNOSIS — M8580 Other specified disorders of bone density and structure, unspecified site: Secondary | ICD-10-CM

## 2022-07-05 ENCOUNTER — Inpatient Hospital Stay: Payer: Managed Care, Other (non HMO) | Attending: Hematology and Oncology | Admitting: Hematology and Oncology

## 2022-07-05 VITALS — BP 144/68 | HR 91 | Temp 97.9°F | Resp 16 | Ht 64.0 in | Wt 197.2 lb

## 2022-07-05 DIAGNOSIS — Z79899 Other long term (current) drug therapy: Secondary | ICD-10-CM | POA: Insufficient documentation

## 2022-07-05 DIAGNOSIS — Z923 Personal history of irradiation: Secondary | ICD-10-CM | POA: Diagnosis not present

## 2022-07-05 DIAGNOSIS — C50411 Malignant neoplasm of upper-outer quadrant of right female breast: Secondary | ICD-10-CM | POA: Diagnosis present

## 2022-07-05 DIAGNOSIS — Z170 Estrogen receptor positive status [ER+]: Secondary | ICD-10-CM | POA: Diagnosis not present

## 2022-07-05 DIAGNOSIS — Z79811 Long term (current) use of aromatase inhibitors: Secondary | ICD-10-CM | POA: Insufficient documentation

## 2022-07-05 NOTE — Progress Notes (Signed)
SURVIVORSHIP VISIT:    REASON FOR VISIT:  Routine follow-up for history of breast cancer.   BRIEF ONCOLOGIC HISTORY:  Oncology History  Breast cancer of upper-outer quadrant of left female breast ([place])  09/29/2006 Surgery   Left breast lumpectomy: Invasive ductal carcinoma 2.6 cm grade 21 SLN positive for micrometastatic disease 9 additional lymph nodes were negative, ER 99%, PR 90%, HER-2 1+, Ki-67 12% Oncotype DX intermediate risk    Procedure   BRCA1 and 2 testing was done and it was negative   11/30/2006 - 04/01/2007 Chemotherapy   Taxotere Cytoxan x4    Radiation Therapy   Adjuvant radiation therapy   09/01/2007 - 07/13/2018 Anti-estrogen oral therapy   Tamoxifen from 2009 to 2011 then switched to Letrozole 2.5 mg daily    Malignant neoplasm of upper-outer quadrant of right breast in female, estrogen receptor positive ([place])  10/19/2021 Mammogram   Mammogram from February 2023 showed a possible mass as well as separate area of possible distortion in the right breast.  No suspicious findings in the left breast.  Right breast mammogram showed possible subtle distortion within the slightly outer right breast.  Suspicious 1 cm upper outer right breast mass.  Ultrasound-guided biopsy is recommended.  No abnormal appearing right axillary lymph nodes.   11/29/2021 Pathology Results   Pathology showed invasive ductal carcinoma, grade 2 along with DCIS in the right breast needle core biopsy at 11 o'clock position.  Biopsy from the right breast upper outer quadrant showed complex sclerosing lesion with usual ductal hyperplasia.  Fibrocystic changes with usual ductal hyperplasia.  Prognostics from the tumor show ER 100% positive strong staining intensity, PR 100% positive, strong staining intensity, Ki-67 of 10% and HER2 equivocal, preliminary FISH was reported negative   12/03/2021 Initial Diagnosis   Malignant neoplasm of upper-outer quadrant of right breast in female, estrogen receptor  positive ([place])   12/08/2021 Cancer Staging   Staging form: Breast, AJCC 8th Edition - Clinical stage from 12/08/2021: Stage IA (cT1b, cN0, cM0, G2, ER+, PR+, HER2-) - Signed by Benay Pike, MD on 12/08/2021 Stage prefix: Initial diagnosis Histologic grading system: 3 grade system   12/14/2021 Genetic Testing   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  Report date is December 14, 2021.   No other pathogenic variants detected in [facility] BRCAP[facility] Panel.  The BRCAplus panel offered by [employer] and includes sequencing and deletion/duplication analysis for the following 8 genes: ATM, BRCA1, BRCA2, CDH1, CHEK2, PALB2, PTEN, and TP53. Results of pan-cancer panel are pending.     Oncotype testing   Oncotype was delayed because of insufficient sample.  This was however recent and the score resulted at 7.  Distant risk of recurrence at 9 years with antiestrogen therapy alone is 3%, no benefit from chemotherapy   01/13/2022 Surgery   She underwent right breast lumpectomy which showed focal atypical lobular hyperplasia, invasive and in situ ductal carcinoma measuring 1.4 cm, all surgical margins negative, no evidence of lymph node involvement.     INTERVAL HISTORY:   Patient is here for follow-up.  She is doing quite well.  She denies any new complaints.  Since last visit, she completed adjuvant radiation and is now on letrozole.  She is tolerating this really well.  She does admit to some fatigue but is not quite sure if this is from having to go through radiation again or if it is related to Femara. She is anxious about discontinuing Femara in the future, was hoping to  take it indefinitely if needed.  She also had a bone density scan since last visit.  She continues to take vitamin D supplementation and loves milk and milk-based products.  Rest of the pertinent 10 point ROS reviewed and negative  REVIEW OF SYSTEMS:  Review of Systems  Constitutional:  Negative for appetite  change, chills, fatigue, fever and unexpected weight change.  HENT:   Negative for hearing loss, lump/mass, sore throat and trouble swallowing.   Eyes:  Negative for eye problems and icterus.  Respiratory:  Negative for chest tightness, cough, hemoptysis and shortness of breath.   Cardiovascular:  Negative for chest pain, leg swelling and palpitations.  Gastrointestinal:  Negative for abdominal distention, abdominal pain, constipation, diarrhea, nausea and vomiting.  Endocrine: Negative for hot flashes.  Genitourinary:  Negative for difficulty urinating.   Musculoskeletal:  Negative for arthralgias.  Skin:  Negative for itching and rash.  Neurological:  Negative for dizziness, extremity weakness, headaches and numbness.  Hematological:  Negative for adenopathy. Does not bruise/bleed easily.  Psychiatric/Behavioral:  Negative for depression. The patient is not nervous/anxious.     PAST MEDICAL/SURGICAL HISTORY:  Past Medical History:  Diagnosis Date   Anxiety    Breast cancer ([place]) 08/2006   invasive breast cancer (T2, N1 ER+/PR+) BRCA 1/11 negative   Breast cancer ([place]) 12/2021   right breast IDC with DCIS   Depression    H/O left breast biopsy 09/2005   intraductal papilloma with atypical ductal hyperplasia   Hyperlipemia    Hypertension    Monoallelic mutation of CHEK2 gene in female patient 12/15/2021   Personal history of breast cancer 12/09/2021   Personal history of chemotherapy 2008   Personal history of radiation therapy 2008   PONV (postoperative nausea and vomiting)    Past Surgical History:  Procedure Laterality Date   AUGMENTATION MAMMAPLASTY Bilateral 2003   BILATERAL SALPINGOOPHORECTOMY  6/10   l   BREAST BIOPSY  2/07   breast excisional biopsy/chemo   BREAST BIOPSY  1/14 or 2/14   right-benign   BREAST ENHANCEMENT SURGERY  2003   saline   BREAST LUMPECTOMY Left 2008   BREAST LUMPECTOMY WITH RADIOACTIVE SEED AND SENTINEL LYMPH NODE BIOPSY Right 01/13/2022    Procedure: RIGHT BREAST LUMPECTOMY WITH RADIOACTIVE SEED X 2 AND SENTINEL LYMPH NODE BIOPSY;  Surgeon: [name], MD;  Location: [facility];  Service: General;  Laterality: Right;   REDUCTION MAMMAPLASTY Bilateral 2009   WISDOM TOOTH EXTRACTION       ALLERGIES:  Allergies  Allergen Reactions   Dayquil [Pseudoephedrine-Apap-Dm] Hives   Lisinopril Cough     CURRENT MEDICATIONS:  Outpatient Encounter Medications as of 07/05/2022  Medication Sig Note   Ascorbic Acid (VITAMIN C) 1000 MG tablet Take 1,000 mg by mouth daily.    atorvastatin (LIPITOR) 40 MG tablet Take 40 mg by mouth daily.    Cholecalciferol (VITAMIN D3) 1.25 MG (50000 UT) CAPS Take by mouth.    escitalopram (LEXAPRO) 10 MG tablet 10 mg daily.    hydrochlorothiazide (HYDRODIURIL) 25 MG tablet Take 25 mg by mouth daily.  09/05/2016: Received from: External Pharmacy   letrozole [facility]) 2.5 MG tablet Take 1 tablet (2.5 mg total) by mouth daily.    zinc gluconate 50 MG tablet Take 50 mg by mouth daily.    [DISCONTINUED] traMADol (ULTRAM) 50 MG tablet Take 1 tablet (50 mg total) by mouth every 6 (six) hours as needed for moderate pain or severe pain.    [DISCONTINUED]  traZODone (DESYREL) 50 MG tablet Take 1 tablet (50 mg total) by mouth at bedtime as needed.    [DISCONTINUED] triamcinolone (KENALOG) 0.025 % cream Apply 1 Application topically 2 (two) times daily.    No facility-administered encounter medications on file as of 07/05/2022.     ONCOLOGIC FAMILY HISTORY:  Family History  Problem Relation Age of Onset   Anemia Mother    Osteoporosis Mother    Alcohol abuse Mother    Alzheimer's disease Father    Throat cancer Maternal Aunt        dx after 24   Hypertension Maternal Grandmother    Heart disease Maternal Grandmother    Hypertension Paternal Grandmother    Heart disease Paternal Grandmother     GENETIC COUNSELING/TESTING: negative  SOCIAL HISTORY:  Social History    Socioeconomic History   Marital status: Married    Spouse name: Not on file   Number of children: Not on file   Years of education: Not on file   Highest education level: Not on file  Occupational History   Not on file  Tobacco Use   Smoking status: Never   Smokeless tobacco: Never  Vaping Use   Vaping Use: Never used  Substance and Sexual Activity   Alcohol use: Not Currently    Comment: social   Drug use: No   Sexual activity: Not Currently    Partners: Male    Birth control/protection: Post-menopausal  Other Topics Concern   Not on file  Social History Narrative   Not on file   Social Determinants of Health   Financial Resource Strain: Not on file  Food Insecurity: Not on file  Transportation Needs: Not on file  Physical Activity: Not on file  Stress: Not on file  Social Connections: Not on file  Intimate Partner Violence: Not on file      OBJECTIVE:  BP (!) 144/68 (BP Location: Left Arm, Patient Position: Sitting)   Pulse 91   Temp 97.9 F (36.6 C) (Temporal)   Resp 16   Ht _0  (1.626 m)   Wt 197 lb 3.2 oz (89.4 kg)   LMP 11/21/2006   SpO2 98%   BMI 33.85 kg/m   Physical Exam Constitutional:      Appearance: Normal appearance.  Chest:     Comments: Bilateral breasts inspected.  Postop seroma in the right breast from last surgery.  No other palpable masses or regional adenopathy Musculoskeletal:        General: No swelling.     Cervical back: Normal range of motion and neck supple. No rigidity.  Lymphadenopathy:     Cervical: No cervical adenopathy.  Skin:    General: Skin is warm and dry.  Neurological:     Mental Status: She is alert.  Psychiatric:        Mood and Affect: Mood normal.    LABORATORY DATA:  None for this visit    ASSESSMENT AND PLAN:   Malignant neoplasm of upper-outer quadrant of right breast in female, estrogen receptor positive ([place]) Marilyn Hansen is a pleasant 66 y.o. female with history of Stage IIA left breast  invasive ductal carcinoma, ER+/PR+/HER2-, diagnosed in 09/2006, treated with lumpectomy, adjuvant chemotherapy, adjuvant radiation therapy, and anti-estrogen therapy with Tamoxifen, followed by Letrozole completed in 06/2018.    Given small tumor with favorable prognostics we have discussed about upfront surgery followed by Oncotype testing.  She underwent right partial mastectomy on 01/13/2022, final pathology showing 1.4 cm invasive ductal carcinoma,  all surgical margins negative, no evidence of lymph node involvement.  Oncotype resulted at 7, distant risk of recurrence at 9 years with antiestrogen therapy alone is 3%, no benefit from chemotherapy She completed adjuvant radiation and she is now on Femara for adjuvant antiestrogen therapy.  She is tolerating this well except for some fatigue.  Her most recent bone density showed ongoing osteopenia which has been progressive over the past 10 years.  We have today discussed about vitamin D supplementation, weightbearing exercises, calcium as tolerated as well as bisphosphonates.  She would like to defer bisphosphonate for some time if possible.  Then with regards to intensified screening given her CHEK2 pathogenic mutation, she is agreeable to breast MRIs alternating with mammograms.  I ordered a breast MRI for January 2024, will plan mammograms in July 2024. She will continue to take Femara, report any breast changes and otherwise return to clinic in 6 months or sooner as needed.  Total time spent: 30 minutes including history, physical exam, review of records, counseling and coordination of care  Thank you for consulting [place] the care of this patient.  Please not hesitate contact us with any additional questions or concerns.  Benay Pike MD

## 2022-07-05 NOTE — Assessment & Plan Note (Addendum)
Marilyn Hansen is a pleasant 66 y.o. female with history of Stage IIA left breast invasive ductal carcinoma, ER+/PR+/HER2-, diagnosed in 09/2006, treated with lumpectomy, adjuvant chemotherapy, adjuvant radiation therapy, and anti-estrogen therapy with Tamoxifen, followed by Letrozole completed in 06/2018.    Given small tumor with favorable prognostics we have discussed about upfront surgery followed by Oncotype testing.  She underwent right partial mastectomy on 01/13/2022, final pathology showing 1.4 cm invasive ductal carcinoma, all surgical margins negative, no evidence of lymph node involvement.  Oncotype resulted at 7, distant risk of recurrence at 9 years with antiestrogen therapy alone is 3%, no benefit from chemotherapy She completed adjuvant radiation and she is now on Femara for adjuvant antiestrogen therapy.  She is tolerating this well except for some fatigue.  Her most recent bone density showed ongoing osteopenia which has been progressive over the past 10 years.  We have today discussed about vitamin D supplementation, weightbearing exercises, calcium as tolerated as well as bisphosphonates.  She would like to defer bisphosphonate for some time if possible.  Then with regards to intensified screening given her CHEK2 pathogenic mutation, she is agreeable to breast MRIs alternating with mammograms.  I ordered a breast MRI for January 2024, will plan mammograms in July 2024. She will continue to take Femara, report any breast changes and otherwise return to clinic in 6 months or sooner as needed.

## 2022-07-22 ENCOUNTER — Other Ambulatory Visit: Payer: Self-pay | Admitting: Family Medicine

## 2022-07-22 DIAGNOSIS — R319 Hematuria, unspecified: Secondary | ICD-10-CM

## 2022-07-25 ENCOUNTER — Telehealth: Payer: Self-pay

## 2022-07-25 ENCOUNTER — Other Ambulatory Visit: Payer: Self-pay

## 2022-07-25 ENCOUNTER — Inpatient Hospital Stay: Payer: Managed Care, Other (non HMO) | Attending: Adult Health | Admitting: Adult Health

## 2022-07-25 VITALS — BP 148/75 | HR 80 | Temp 97.5°F | Resp 18 | Ht 64.0 in | Wt 195.1 lb

## 2022-07-25 DIAGNOSIS — Z79811 Long term (current) use of aromatase inhibitors: Secondary | ICD-10-CM | POA: Insufficient documentation

## 2022-07-25 DIAGNOSIS — M8580 Other specified disorders of bone density and structure, unspecified site: Secondary | ICD-10-CM | POA: Insufficient documentation

## 2022-07-25 DIAGNOSIS — Z79899 Other long term (current) drug therapy: Secondary | ICD-10-CM | POA: Insufficient documentation

## 2022-07-25 DIAGNOSIS — Z170 Estrogen receptor positive status [ER+]: Secondary | ICD-10-CM | POA: Diagnosis not present

## 2022-07-25 DIAGNOSIS — Z853 Personal history of malignant neoplasm of breast: Secondary | ICD-10-CM | POA: Diagnosis not present

## 2022-07-25 DIAGNOSIS — C50411 Malignant neoplasm of upper-outer quadrant of right female breast: Secondary | ICD-10-CM | POA: Diagnosis present

## 2022-07-25 DIAGNOSIS — Z923 Personal history of irradiation: Secondary | ICD-10-CM | POA: Diagnosis not present

## 2022-07-25 NOTE — Progress Notes (Signed)
SURVIVORSHIP VISIT:  BRIEF ONCOLOGIC HISTORY:  Oncology History  Breast cancer of upper-outer quadrant of left female breast ([place])  09/29/2006 Surgery   Left breast lumpectomy: Invasive ductal carcinoma 2.6 cm grade 21 SLN positive for micrometastatic disease 9 additional lymph nodes were negative, ER 99%, PR 90%, HER-2 1+, Ki-67 12% Oncotype DX intermediate risk    Procedure   BRCA1 and 2 testing was done and it was negative   11/30/2006 - 04/01/2007 Chemotherapy   Taxotere Cytoxan x4    Radiation Therapy   Adjuvant radiation therapy   09/01/2007 - 07/13/2018 Anti-estrogen oral therapy   Tamoxifen from 2009 to 2011 then switched to Letrozole 2.5 mg daily    01/2022 -  Anti-estrogen oral therapy   Letrozole   03/21/2022 - 04/18/2022 Radiation Therapy   Site Technique Total Dose (Gy) Dose per Fx (Gy) Completed Fx Beam Energies  Breast, Right: Breast_R 3D 40.05/40.05 2.67 15/15 10XFFF  Breast, Right: Breast_R_Bst 3D 10/10 2 5/5 6X, 10X     Malignant neoplasm of upper-outer quadrant of right breast in female, estrogen receptor positive ([place])  10/19/2021 Mammogram   Mammogram from February 2023 showed a possible mass as well as separate area of possible distortion in the right breast.  No suspicious findings in the left breast.  Right breast mammogram showed possible subtle distortion within the slightly outer right breast.  Suspicious 1 cm upper outer right breast mass.  Ultrasound-guided biopsy is recommended.  No abnormal appearing right axillary lymph nodes.   11/29/2021 Pathology Results   Pathology showed invasive ductal carcinoma, grade 2 along with DCIS in the right breast needle core biopsy at 11 o'clock position.  Biopsy from the right breast upper outer quadrant showed complex sclerosing lesion with usual ductal hyperplasia.  Fibrocystic changes with usual ductal hyperplasia.  Prognostics from the tumor show ER 100% positive strong staining intensity, PR 100% positive, strong staining  intensity, Ki-67 of 10% and HER2 equivocal, preliminary FISH was reported negative   12/03/2021 Initial Diagnosis   Malignant neoplasm of upper-outer quadrant of right breast in female, estrogen receptor positive ([place])   12/08/2021 Cancer Staging   Staging form: Breast, AJCC 8th Edition - Clinical stage from 12/08/2021: Stage IA (cT1b, cN0, cM0, G2, ER+, PR+, HER2-) - Signed by Benay Pike, MD on 12/08/2021 Stage prefix: Initial diagnosis Histologic grading system: 3 grade system   12/14/2021 Genetic Testing   Pathogenic variant in CHEK2 gene at  c.1100delC ([id number]*16).  Report date is December 14, 2021.   No other pathogenic variants detected in [facility] BRCAPlus Panel.  The BRCAplus panel offered by [employer] and includes sequencing and deletion/duplication analysis for the following 8 genes: ATM, BRCA1, BRCA2, CDH1, CHEK2, PALB2, PTEN, and TP53. Results of pan-cancer panel are pending.     Oncotype testing   Oncotype was delayed because of insufficient sample.  This was however recent and the score resulted at 7.  Distant risk of recurrence at 9 years with antiestrogen therapy alone is 3%, no benefit from chemotherapy   01/13/2022 Surgery   She underwent right breast lumpectomy which showed focal atypical lobular hyperplasia, invasive and in situ ductal carcinoma measuring 1.4 cm, all surgical margins negative, no evidence of lymph node involvement.   01/2022 -  Anti-estrogen oral therapy   Letrozole   03/21/2022 - 04/18/2022 Radiation Therapy   Site Technique Total Dose (Gy) Dose per Fx (Gy) Completed Fx Beam Energies  Breast, Right: Breast_R 3D 40.05/40.05 2.67 15/15 10XFFF  Breast, Right: Breast_R_Bst 3D  10/10 2 5/5 6X, 10X       INTERVAL HISTORY:  Marilyn Hansen to review her survivorship care plan detailing her treatment course for breast cancer, as well as monitoring long-term side effects of that treatment, education regarding health maintenance, screening, and overall wellness  and health promotion.     She is doing moderately well.  She is tolerating letrozole without difficulty.  Last Monday, 11/27 she developed hematuria.  It improved, then returned on 11/29 much worse than prior.  She was evaluated by her PCP on Thursday, 11/30.  Her urine was tested and was negative for UTI.  The urine was positive for blood and protein.  She also has no history of kidney stones.  She has no significant pain other than a twinge from time to time in her left pelvis.  She continues to have blood in her urine.    REVIEW OF SYSTEMS:  Review of Systems  Constitutional:  Negative for appetite change, chills, fatigue, fever and unexpected weight change.  HENT:   Negative for hearing loss, lump/mass and trouble swallowing.   Eyes:  Negative for eye problems and icterus.  Respiratory:  Negative for chest tightness, cough and shortness of breath.   Cardiovascular:  Negative for chest pain, leg swelling and palpitations.  Gastrointestinal:  Negative for abdominal distention, abdominal pain, blood in stool, constipation, diarrhea, nausea and vomiting.  Endocrine: Negative for hot flashes.  Genitourinary:  Positive for hematuria. Negative for bladder incontinence, difficulty urinating, dysuria, frequency, menstrual problem, nocturia, pelvic pain and vaginal bleeding.   Musculoskeletal:  Negative for arthralgias.  Skin:  Negative for itching and rash.  Neurological:  Negative for dizziness, extremity weakness, headaches and numbness.  Hematological:  Negative for adenopathy. Does not bruise/bleed easily.  Psychiatric/Behavioral:  Negative for depression. The patient is not nervous/anxious.   Breast: Denies any new nodularity, masses, tenderness, nipple changes, or nipple discharge.     PAST MEDICAL/SURGICAL HISTORY:  Past Medical History:  Diagnosis Date   Anxiety    Breast cancer ([place]) 08/2006   invasive breast cancer (T2, N1 ER+/PR+) BRCA 1/11 negative   Breast cancer ([place]) 12/2021    right breast IDC with DCIS   Depression    H/O left breast biopsy 09/2005   intraductal papilloma with atypical ductal hyperplasia   Hyperlipemia    Hypertension    Monoallelic mutation of CHEK2 gene in female patient 12/15/2021   Personal history of breast cancer 12/09/2021   Personal history of chemotherapy 2008   Personal history of radiation therapy 2008   PONV (postoperative nausea and vomiting)    Past Surgical History:  Procedure Laterality Date   AUGMENTATION MAMMAPLASTY Bilateral 2003   BILATERAL SALPINGOOPHORECTOMY  6/10   l   BREAST BIOPSY  2/07   breast excisional biopsy/chemo   BREAST BIOPSY  1/14 or 2/14   right-benign   BREAST ENHANCEMENT SURGERY  2003   saline   BREAST LUMPECTOMY Left 2008   BREAST LUMPECTOMY WITH RADIOACTIVE SEED AND SENTINEL LYMPH NODE BIOPSY Right 01/13/2022   Procedure: RIGHT BREAST LUMPECTOMY WITH RADIOACTIVE SEED X 2 AND SENTINEL LYMPH NODE BIOPSY;  Surgeon: [name], MD;  Location: [facility];  Service: General;  Laterality: Right;   REDUCTION MAMMAPLASTY Bilateral 2009   WISDOM TOOTH EXTRACTION       ALLERGIES:  Allergies  Allergen Reactions   Dayquil [Pseudoephedrine-Apap-Dm] Hives   Lisinopril Cough     CURRENT MEDICATIONS:  Outpatient Encounter Medications as of 07/25/2022  Medication  Sig Note   Ascorbic Acid (VITAMIN C) 1000 MG tablet Take 1,000 mg by mouth daily.    atorvastatin (LIPITOR) 40 MG tablet Take 40 mg by mouth daily.    Cholecalciferol (VITAMIN D3) 1.25 MG (50000 UT) CAPS Take by mouth.    escitalopram (LEXAPRO) 10 MG tablet 10 mg daily.    hydrochlorothiazide (HYDRODIURIL) 25 MG tablet Take 25 mg by mouth daily.  09/05/2016: Received from: External Pharmacy   letrozole [facility]) 2.5 MG tablet Take 1 tablet (2.5 mg total) by mouth daily.    zinc gluconate 50 MG tablet Take 50 mg by mouth daily.    No facility-administered encounter medications on file as of 07/25/2022.     ONCOLOGIC  FAMILY HISTORY:  Family History  Problem Relation Age of Onset   Anemia Mother    Osteoporosis Mother    Alcohol abuse Mother    Alzheimer's disease Father    Throat cancer Maternal Aunt        dx after 28   Hypertension Maternal Grandmother    Heart disease Maternal Grandmother    Hypertension Paternal Grandmother    Heart disease Paternal Grandmother      SOCIAL HISTORY:  Social History   Socioeconomic History   Marital status: Married    Spouse name: Not on file   Number of children: Not on file   Years of education: Not on file   Highest education level: Not on file  Occupational History   Not on file  Tobacco Use   Smoking status: Never   Smokeless tobacco: Never  Vaping Use   Vaping Use: Never used  Substance and Sexual Activity   Alcohol use: Not Currently    Comment: social   Drug use: No   Sexual activity: Not Currently    Partners: Male    Birth control/protection: Post-menopausal  Other Topics Concern   Not on file  Social History Narrative   Not on file   Social Determinants of Health   Financial Resource Strain: Not on file  Food Insecurity: Not on file  Transportation Needs: Not on file  Physical Activity: Not on file  Stress: Not on file  Social Connections: Not on file  Intimate Partner Violence: Not on file     OBSERVATIONS/OBJECTIVE:  BP (!) 148/75 (BP Location: Left Arm, Patient Position: Sitting)   Pulse 80   Temp (!) 97.5 F (36.4 C) (Tympanic)   Resp 18   Ht _0  (1.626 m)   Wt 195 lb 1.6 oz (88.5 kg)   LMP 11/21/2006   SpO2 99%   BMI 33.49 kg/m  GENERAL: Patient is a well appearing female in no acute distress HEENT:  Sclerae anicteric.  Oropharynx clear and moist. No ulcerations or evidence of oropharyngeal candidiasis. Neck is supple.  NODES:  No cervical, supraclavicular, or axillary lymphadenopathy palpated.  BREAST EXAM: Left breast status postlumpectomy and radiation no sign of local recurrence right breast status  post lumpectomy and radiation no sign of local recurrence. LUNGS:  Clear to auscultation bilaterally.  No wheezes or rhonchi. HEART:  Regular rate and rhythm. No murmur appreciated. ABDOMEN:  Soft, nontender.  Positive, normoactive bowel sounds. No organomegaly palpated. MSK:  No focal spinal tenderness to palpation. Full range of motion bilaterally in the upper extremities. EXTREMITIES:  No peripheral edema.   SKIN:  Clear with no obvious rashes or skin changes. No nail dyscrasia. NEURO:  Nonfocal. Well oriented.  Appropriate affect.  LABORATORY DATA:  None for this visit.  DIAGNOSTIC IMAGING:  None for this visit.      ASSESSMENT AND PLAN:  Marilyn Hansen is a pleasant 66 y.o. female with Stage 1A right breast invasive ductal carcinoma, ER+/PR+/HER2-, diagnosed in 11/2021, treated with lumpectomy, adjuvant radiation therapy, and anti-estrogen therapy with letrozole beginning in June 2023.  She presents to the Survivorship Clinic for our initial meeting and routine follow-up post-completion of treatment for breast cancer.    1. Stage 1A right breast cancer:  Marilyn Hansen is continuing to recover from definitive treatment for breast cancer. She will follow-up with her medical oncologist, Dr. Chryl Heck in 6 months with history and physical exam per surveillance protocol.  She will continue her anti-estrogen therapy with Letrozole. Thus far, she is tolerating the Letrozole well, with minimal side effects. She was instructed to make Dr. Chryl Heck or myself aware if she begins to experience any worsening side effects of the medication and I could see her back in clinic to help manage those side effects, as needed. Her mammogram is due 09/2022; orders placed today.  Due to her CHEK2 abnormality she should undergo annual MRI alternating with her mammogram.  I extended her current breast MRI orders so she will have her mammogram in February and her breast MRI in August. Today, a comprehensive survivorship care plan  and treatment summary was reviewed with the patient today detailing her breast cancer diagnosis, treatment course, potential late/long-term effects of treatment, appropriate follow-up care with recommendations for the future, and patient education resources.  A copy of this summary, along with a letter will be sent to the patient's primary care provider via mail/fax/In Basket message after today's visit.    2. Hematuria: I reviewed with Marilyn Hansen that with her CHEK2 abnormality there is not a significant increased risk for bladder or kidney cancer.  I let her know that the concern with hematuria typically is infection and once that is ruled out then it is possible kidney stone.  The CT scan can help evaluate for a kidney stone.  Once it is determined whether or not she has a kidney stone she can be referred to urology for management.  Marilyn Hansen was relieved to hear this and we have asked for [facility] to fax [place] their urinalysis and urine culture results.  I gave her the phone number for imaging to get her CT scan scheduled.  3. Bone health:  Given Marilyn Hansen's age/history of breast cancer and her current treatment regimen including anti-estrogen therapy with letrozole, she is at risk for bone demineralization.  Her last DEXA scan was completed on June 28, 2022 and demonstrated a T-score of -1.7 consistent with osteopenia.  Repeat in 2 years is recommended.  She was given education on specific activities to promote bone health.  4. Cancer screening:  Due to Marilyn Hansen history and her age, she should receive screening for skin cancers, colon cancer, and gynecologic cancers.  The information and recommendations are listed on the patient's comprehensive care plan/treatment summary and were reviewed in detail with the patient.    5. Health maintenance and wellness promotion: Marilyn Hansen was encouraged to consume 5-7 servings of fruits and vegetables per day. We reviewed the "Nutrition Rainbow" handout.  She was also  encouraged to engage in moderate to vigorous exercise for 30 minutes per day most days of the week. We discussed the [employer] fitness program, which is designed for cancer survivors to help them become more physically fit after cancer treatments.  She was instructed to limit her  alcohol consumption and continue to abstain from tobacco use.     6. Support services/counseling: It is not uncommon for this period of the patient's cancer care trajectory to be one of many emotions and stressors.  She was given information regarding our available services and encouraged to contact me with any questions or for help enrolling in any of our support group/programs.    Follow up instructions:    -Return to cancer center in June 2024 for follow-up with Dr. Chryl Heck -Mammogram due in February 2024 -Breast MRI in August 2024 -She is welcome to return back to the Survivorship Clinic at any time; no additional follow-up needed at this time.  -Consider referral back to survivorship as a long-term survivor for continued surveillance  The patient was provided an opportunity to ask questions and all were answered. The patient agreed with the plan and demonstrated an understanding of the instructions.   Total encounter time:50 minutes*in face-to-face visit time, chart review, lab review, care coordination, order entry, and documentation of the encounter time.    Wilber Bihari, NP 07/25/22 11:50 AM Medical Oncology and Hematology [facility] [place], [place] [address] Tel. [phone]    Fax. [phone]  *Total Encounter Time as defined by the Centers for Medicare and Medicaid Services includes, in addition to the face-to-face time of a patient visit (documented in the note above) non-face-to-face time: obtaining and reviewing outside history, ordering and reviewing medications, tests or procedures, care coordination (communications with other health care professionals or  caregivers) and documentation in the medical record.

## 2022-07-25 NOTE — Telephone Encounter (Signed)
Placed call to [facility] to obtain colonoscopy records, as well as [facility] PCP to receive most recent labs and OV note. Instructions given to fax these records to [phone]. Reuben Likes states she will send a note back for request.

## 2022-07-25 NOTE — Progress Notes (Signed)
Fax received from [place] [facility] containing pt's most recent labs and OV explaining hematuria and plan for that. Given to NP to f/u.

## 2022-07-26 ENCOUNTER — Encounter: Payer: Self-pay | Admitting: Hematology and Oncology

## 2022-08-01 ENCOUNTER — Encounter: Payer: Self-pay | Admitting: Hematology and Oncology

## 2022-08-04 ENCOUNTER — Ambulatory Visit
Admission: RE | Admit: 2022-08-04 | Discharge: 2022-08-04 | Disposition: A | Discharge status: Home / Self Care | Payer: Managed Care, Other (non HMO) | Source: Ambulatory Visit | Attending: Family Medicine | Admitting: Family Medicine

## 2022-08-04 DIAGNOSIS — R319 Hematuria, unspecified: Secondary | ICD-10-CM

## 2022-08-04 MED ORDER — IOPAMIDOL (ISOVUE-300) INJECTION 61%
100.0000 mL | Freq: Once | INTRAVENOUS | Status: AC | PRN
  Administered 2022-08-04: 100 mL via INTRAVENOUS

## 2022-08-09 ENCOUNTER — Other Ambulatory Visit: Payer: Self-pay | Admitting: Urology

## 2022-08-09 ENCOUNTER — Encounter (HOSPITAL_BASED_OUTPATIENT_CLINIC_OR_DEPARTMENT_OTHER): Payer: Self-pay | Admitting: Urology

## 2022-08-10 NOTE — H&P (Signed)
1 - Left Ureteral Stone - 33m left prox ureeral stone (L3 level) by CT 07/2022. Stone is solitary. SSD 12 cm. KUB visible just above L3 transverse process. UA without infectious parameters. No prior stones.   PMH sig for obesity, Breast CA (s/p lump/XRT bilateral metachenous, has Chk2 mutation). Retired from [employer] Her PCP is LKathyrn LassMD.   Today " Marilyn Hansen " is seen as new patient work in for left ureteral stone.     ALLERGIES: Lisinopril    MEDICATIONS: Hydrochlorothiazide 25 mg tablet  Atorvastatin Calcium 40 mg tablet  Escitalopram Oxalate 10 mg tablet  Letrozole 2.5 mg tablet  Trazodone Hcl 50 mg tablet     GU PSH: None   NON-GU PSH: Breast lumpectomy     GU PMH: None   NON-GU PMH: Anxiety Breast Cancer, History Hypercholesterolemia Hypertension    FAMILY HISTORY: No Family History    SOCIAL HISTORY: Marital Status: Married Ethnicity: Not Hispanic Or Latino; Race: White Current Smoking Status: Patient has never smoked.   Tobacco Use Assessment Completed: Used Tobacco in last 30 days? Does not drink anymore.  Drinks 1 caffeinated drink per day.    REVIEW OF SYSTEMS:    GU Review Female:   Patient reports hard to postpone urination and get up at night to urinate. Patient denies frequent urination, burning /pain with urination, leakage of urine, stream starts and stops, trouble starting your stream, have to strain to urinate, and being pregnant.  Gastrointestinal (Upper):   Patient reports nausea. Patient denies vomiting and indigestion/ heartburn.  Gastrointestinal (Lower):   Patient reports constipation. Patient denies diarrhea.  Constitutional:   Patient reports fatigue. Patient denies fever, night sweats, and weight loss.  Skin:   Patient reports itching. Patient denies skin rash/ lesion.  Eyes:   Patient denies blurred vision and double vision.  Ears/ Nose/ Throat:   Patient denies sore throat and sinus problems.  Hematologic/Lymphatic:    Patient denies swollen glands and easy bruising.  Cardiovascular:   Patient denies leg swelling and chest pains.  Respiratory:   Patient denies cough and shortness of breath.  Endocrine:   Patient denies excessive thirst.  Musculoskeletal:   Patient reports back pain. Patient denies joint pain.  Neurological:   Patient reports headaches. Patient denies dizziness.  Psychologic:   Patient reports anxiety. Patient denies depression.   VITAL SIGNS:      08/09/2022 11:49 AM  Weight 190 lb / 86.18 kg  Height 65.5 in / 166.37 cm  BP 129/80 mmHg  Pulse 81 /min  Temperature 97.8 F / 36.5 C  BMI 31.1 kg/m   GU PHYSICAL EXAMINATION:    Bladder: Normal to palpation, no tenderness, no mass, normal size.   MULTI-SYSTEM PHYSICAL EXAMINATION:    Constitutional: Well-nourished. No physical deformities. Normally developed. Good grooming. Very pleasant. Husband with her as well.   Neck: Neck symmetrical, not swollen. Normal tracheal position.  Respiratory: No labored breathing, no use of accessory muscles.   Cardiovascular: Normal temperature, normal extremity pulses, no swelling, no varicosities.  Lymphatic: No enlargement of neck, axillae, groin.  Skin: No paleness, no jaundice, no cyanosis. No lesion, no ulcer, no rash.  Neurologic / Psychiatric: Oriented to time, oriented to place, oriented to person. No depression, no anxiety, no agitation.  Gastrointestinal: No mass, no tenderness, no rigidity, moderate truncal obesity. Very very mild left CVAT.   Eyes: Normal conjunctivae. Normal eyelids.  Ears, Nose, Mouth, and Throat: Left ear no scars, no lesions,  no masses. Right ear no scars, no lesions, no masses. Nose no scars, no lesions, no masses. Normal hearing. Normal lips.  Musculoskeletal: Normal gait and station of head and neck.     Complexity of Data:  Records Review:   Previous Hospital Records, Previous Patient Records  Urine Test Review:   Urinalysis  X-Ray Review: KUB: Reviewed Films.  Discussed With Patient.  C.T. Abdomen/Pelvis: Reviewed Films. Reviewed Report. Discussed With Patient.     PROCEDURES:         KUB - [id number]  A single view of the abdomen is obtained.      Patient confirmed No Neulasta OnPro Device.   Left proximal ureteral stone just above L3 TP. Some L spine DJD changes with loss of disc height.          Urinalysis w/Scope Dipstick Dipstick Cont'd Micro  Color: Yellow Bilirubin: Neg mg/dL WBC/hpf: 0 - 5/hpf  Appearance: Clear Ketones: Neg mg/dL RBC/hpf: NS (Not Seen)  Specific Gravity: 1.025 Blood: Neg ery/uL Bacteria: NS (Not Seen)  pH: 6.0 Protein: Neg mg/dL Cystals: NS (Not Seen)  Glucose: Neg mg/dL Urobilinogen: 0.2 mg/dL Casts: Hyaline    Nitrites: Neg Trichomonas: Not Present    Leukocyte Esterase: Trace leu/uL Mucous: Present      Epithelial Cells: NS (Not Seen)      Yeast: NS (Not Seen)      Sperm: Not Present    Notes: Unconcentrated microscopic exam due to qns    ASSESSMENT:      ICD-10 Details  1 GU:   Renal and ureteral calculus - N20.2 Left, Undiagnosed New Problem   PLAN:            Medications New Meds: Ondansetron Odt 8 mg tablet,disintegrating 1 tab Q 8 prn nausea from kidney stone   #10  0 Refill(s)  Oxycodone Hcl 5 mg tablet 1 tab Q6 prn breakthrough kidney stone pain   #10  0 Refill(s)  Pharmacy Name:  [id number]  Address:  [address]   [place], [place] [id number]  Phone:  [phone]  Fax:  [phone]            Orders X-Rays: KUB          Schedule         Document Letter(s):  Created for Patient: Clinical Summary         Notes:   Discussed options for her ureteral stone including medical therapy (<50% chance of passage at current size), SWL (90% chance single stage passage), or ureteroscopy (>90% chance single stage passage) in order of increasing efficacy but also invasivemess.   She opts for SWL next avail and I agree. RIsks, benefits, peri-treatmetn course discussed.  Avoid NSAIDs for now. oxycodone, zofran PRN for interval colic.   JJ:[id number] Sabra Heck MD, pt's PCP.

## 2022-08-11 ENCOUNTER — Encounter (HOSPITAL_BASED_OUTPATIENT_CLINIC_OR_DEPARTMENT_OTHER)
Admission: RE | Disposition: A | Discharge status: Home / Self Care | Payer: Self-pay | Source: Home / Self Care | Attending: Urology

## 2022-08-11 ENCOUNTER — Ambulatory Visit (HOSPITAL_BASED_OUTPATIENT_CLINIC_OR_DEPARTMENT_OTHER)
Admission: RE | Admit: 2022-08-11 | Discharge: 2022-08-11 | Disposition: A | Discharge status: Home / Self Care | Payer: Managed Care, Other (non HMO) | Attending: Urology | Admitting: Urology

## 2022-08-11 ENCOUNTER — Encounter (HOSPITAL_BASED_OUTPATIENT_CLINIC_OR_DEPARTMENT_OTHER): Payer: Self-pay | Admitting: Urology

## 2022-08-11 ENCOUNTER — Other Ambulatory Visit: Payer: Self-pay

## 2022-08-11 ENCOUNTER — Ambulatory Visit (HOSPITAL_COMMUNITY): Payer: Managed Care, Other (non HMO)

## 2022-08-11 DIAGNOSIS — Z6831 Body mass index (BMI) 31.0-31.9, adult: Secondary | ICD-10-CM | POA: Insufficient documentation

## 2022-08-11 DIAGNOSIS — Z853 Personal history of malignant neoplasm of breast: Secondary | ICD-10-CM | POA: Insufficient documentation

## 2022-08-11 DIAGNOSIS — N201 Calculus of ureter: Secondary | ICD-10-CM | POA: Diagnosis not present

## 2022-08-11 DIAGNOSIS — E669 Obesity, unspecified: Secondary | ICD-10-CM | POA: Diagnosis not present

## 2022-08-11 HISTORY — PX: EXTRACORPOREAL SHOCK WAVE LITHOTRIPSY: SHX1557

## 2022-08-11 SURGERY — LITHOTRIPSY, ESWL
Anesthesia: LOCAL | Laterality: Left

## 2022-08-11 MED ORDER — DIPHENHYDRAMINE HCL 25 MG PO CAPS
25.0000 mg | ORAL_CAPSULE | ORAL | Status: AC
  Administered 2022-08-11: 25 mg via ORAL

## 2022-08-11 MED ORDER — SODIUM CHLORIDE 0.9 % IV SOLN: INTRAVENOUS | Status: DC

## 2022-08-11 MED ORDER — DIAZEPAM 5 MG PO TABS
10.0000 mg | ORAL_TABLET | ORAL | Status: AC
  Administered 2022-08-11: 10 mg via ORAL

## 2022-08-11 MED ORDER — CIPROFLOXACIN HCL 500 MG PO TABS
500.0000 mg | ORAL_TABLET | ORAL | Status: AC
  Administered 2022-08-11: 500 mg via ORAL

## 2022-08-11 MED ORDER — CIPROFLOXACIN HCL 500 MG PO TABS
ORAL_TABLET | ORAL | Status: AC
  Filled 2022-08-11: qty 1

## 2022-08-11 MED ORDER — DIAZEPAM 5 MG PO TABS
ORAL_TABLET | ORAL | Status: AC
  Filled 2022-08-11: qty 2

## 2022-08-11 MED ORDER — DIPHENHYDRAMINE HCL 25 MG PO CAPS
ORAL_CAPSULE | ORAL | Status: AC
  Filled 2022-08-11: qty 1

## 2022-08-11 NOTE — Interval H&P Note (Signed)
History and Physical Interval Note:  08/11/2022 12:14 PM  Marilyn Hansen  has presented today for surgery, with the diagnosis of URETERAL STONE.  The various methods of treatment have been discussed with the patient and family. After consideration of risks, benefits and other options for treatment, the patient has consented to  Procedure(s): EXTRACORPOREAL SHOCK WAVE LITHOTRIPSY (ESWL) (Left) as a surgical intervention.  The patient's history has been reviewed, patient examined, no change in status, stable for surgery.  I have reviewed the patient's chart and labs.  Questions were answered to the patient's satisfaction.     Harper Smoker A Taneisha Fuson

## 2022-08-11 NOTE — Discharge Instructions (Addendum)
I have reviewed discharge instructions in detail with the patient. They will follow-up with me or their physician as scheduled. My nurse will also be calling the patients as per protocol.          Post Anesthesia Home Care Instructions  Activity: Get plenty of rest for the remainder of the day. A responsible individual must stay with you for 24 hours following the procedure.  For the next 24 hours, DO NOT: -Drive a car -Paediatric nurse -Drink alcoholic beverages -Take any medication unless instructed by your physician -Make any legal decisions or sign important papers.  Meals: Start with liquid foods such as gelatin or soup. Progress to regular foods as tolerated. Avoid greasy, spicy, heavy foods. If nausea and/or vomiting occur, drink only clear liquids until the nausea and/or vomiting subsides. Call your physician if vomiting continues.  Special Instructions/Symptoms: Your throat may feel dry or sore from the anesthesia or the breathing tube placed in your throat during surgery. If this causes discomfort, gargle with warm salt water. The discomfort should disappear within 24 hours.

## 2022-08-12 ENCOUNTER — Encounter (HOSPITAL_BASED_OUTPATIENT_CLINIC_OR_DEPARTMENT_OTHER): Payer: Self-pay | Admitting: Urology

## 2022-08-21 ENCOUNTER — Other Ambulatory Visit: Payer: Self-pay | Admitting: Hematology and Oncology

## 2022-10-20 ENCOUNTER — Other Ambulatory Visit (HOSPITAL_COMMUNITY)
Admission: RE | Admit: 2022-10-20 | Discharge: 2022-10-20 | Disposition: A | Discharge status: Home / Self Care | Payer: Managed Care, Other (non HMO) | Source: Ambulatory Visit | Attending: Obstetrics & Gynecology | Admitting: Obstetrics & Gynecology

## 2022-10-20 ENCOUNTER — Ambulatory Visit (HOSPITAL_BASED_OUTPATIENT_CLINIC_OR_DEPARTMENT_OTHER): Payer: Managed Care, Other (non HMO) | Admitting: Obstetrics & Gynecology

## 2022-10-20 ENCOUNTER — Ambulatory Visit
Admission: RE | Admit: 2022-10-20 | Discharge: 2022-10-20 | Disposition: A | Discharge status: Home / Self Care | Payer: Managed Care, Other (non HMO) | Source: Ambulatory Visit | Attending: Adult Health | Admitting: Adult Health

## 2022-10-20 ENCOUNTER — Encounter: Payer: Self-pay | Admitting: Hematology and Oncology

## 2022-10-20 ENCOUNTER — Encounter (HOSPITAL_BASED_OUTPATIENT_CLINIC_OR_DEPARTMENT_OTHER): Payer: Self-pay | Admitting: Obstetrics & Gynecology

## 2022-10-20 ENCOUNTER — Other Ambulatory Visit: Payer: Self-pay | Admitting: Adult Health

## 2022-10-20 VITALS — BP 140/71 | HR 80 | Ht 65.0 in | Wt 192.6 lb

## 2022-10-20 DIAGNOSIS — Z853 Personal history of malignant neoplasm of breast: Secondary | ICD-10-CM | POA: Diagnosis not present

## 2022-10-20 DIAGNOSIS — Z01419 Encounter for gynecological examination (general) (routine) without abnormal findings: Secondary | ICD-10-CM

## 2022-10-20 DIAGNOSIS — Z170 Estrogen receptor positive status [ER+]: Secondary | ICD-10-CM

## 2022-10-20 DIAGNOSIS — N6311 Unspecified lump in the right breast, upper outer quadrant: Secondary | ICD-10-CM | POA: Diagnosis not present

## 2022-10-20 DIAGNOSIS — Z1379 Encounter for other screening for genetic and chromosomal anomalies: Secondary | ICD-10-CM

## 2022-10-20 DIAGNOSIS — Z124 Encounter for screening for malignant neoplasm of cervix: Secondary | ICD-10-CM | POA: Diagnosis present

## 2022-10-20 DIAGNOSIS — I10 Essential (primary) hypertension: Secondary | ICD-10-CM

## 2022-10-20 NOTE — Progress Notes (Signed)
67 y.o. G0P0000 Married White or Caucasian female here for annual exam.  Had right sided breast cancer since she was last seen.  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  This increased lifetime risk of breast cancer to 20-40%.  Yearly breast MRI recommended but RRmastectomy not recommended due to insufficient evidence.  Also, increased colon cancer risks present.  Pt recommended to have at least q 5 year colonoscopy screening.  Increased risks for possible ovarian, uterine and thyroid present but no clear recommendations.  Will reach out to Loralyn [facility] to be sure.  Pt has also had issues with kidney stones since last seen.  Did have lithotripsy done for treatment.  Has done well.    Denies vaginal bleeding.    Had area in right breast that she's felt that is firm and deep.  The had diagnostic imaging earlier today.  It was normal.  Patient's last menstrual period was 11/21/2006.          Sexually active: Yes.    The current method of family planning is post menopausal status.    Smoker:  no  Health Maintenance: Pap:  05/15/2020 Negative History of abnormal Pap:  no MMG:  10/20/2022 Negative Colonoscopy:  02/04/2022 BMD:   06/28/2022 Osteopenia Screening Labs: does with PCP, Dr. Kathyrn Lass   reports that she has never smoked. She has never used smokeless tobacco. She reports that she does not currently use alcohol. She reports that she does not use drugs.  Past Medical History:  Diagnosis Date   Anxiety    Breast cancer ([facility]) 08/2006   invasive breast cancer (T2, N1 ER+/PR+) BRCA 1/11 negative   Breast cancer ([place]) 12/2021   right breast IDC with DCIS   Depression    H/O left breast biopsy 09/2005   intraductal papilloma with atypical ductal hyperplasia   Hyperlipemia    Hypertension    Monoallelic mutation of CHEK2 gene in female patient 12/15/2021   Personal history of breast cancer 12/09/2021   Personal history of chemotherapy 2008   Personal history of radiation therapy  2008   PONV (postoperative nausea and vomiting)     Past Surgical History:  Procedure Laterality Date   AUGMENTATION MAMMAPLASTY Bilateral 2003   BILATERAL SALPINGOOPHORECTOMY  6/10   l   BREAST BIOPSY  2/07   breast excisional biopsy/chemo   BREAST BIOPSY  1/14 or 2/14   right-benign   BREAST ENHANCEMENT SURGERY  2003   saline   BREAST LUMPECTOMY Left 2008   BREAST LUMPECTOMY WITH RADIOACTIVE SEED AND SENTINEL LYMPH NODE BIOPSY Right 01/13/2022   Procedure: RIGHT BREAST LUMPECTOMY WITH RADIOACTIVE SEED X 2 AND SENTINEL LYMPH NODE BIOPSY;  Surgeon: [name], MD;  Location: [facility];  Service: General;  Laterality: Right;   EXTRACORPOREAL SHOCK WAVE LITHOTRIPSY Left 08/11/2022   Procedure: EXTRACORPOREAL SHOCK WAVE LITHOTRIPSY (ESWL);  Surgeon: Bjorn Loser, MD;  Location: [facility];  Service: Urology;  Laterality: Left;   REDUCTION MAMMAPLASTY Bilateral 2009   WISDOM TOOTH EXTRACTION      Current Outpatient Medications  Medication Sig Dispense Refill   atorvastatin (LIPITOR) 40 MG tablet Take 40 mg by mouth daily.     Cholecalciferol (VITAMIN D3) 1.25 MG (50000 UT) CAPS Take 50 mcg by mouth at bedtime.     escitalopram (LEXAPRO) 10 MG tablet 10 mg daily.     hydrochlorothiazide (HYDRODIURIL) 25 MG tablet Take 25 mg by mouth daily.      letrozole (FEMARA) 2.5 MG  tablet TAKE 1 TABLET(2.5 MG) BY MOUTH DAILY 90 tablet 1   traZODone (DESYREL) 50 MG tablet Take 50 mg by mouth at bedtime as needed for sleep. Takes 1/2 tab prn.     vitamin C (ASCORBIC ACID) 250 MG tablet Take 250 mg by mouth 2 (two) times daily. Gummies.     zinc gluconate 50 MG tablet Take 50 mg by mouth daily.     No current facility-administered medications for this visit.    Family History  Problem Relation Age of Onset   Anemia Mother    Osteoporosis Mother    Alcohol abuse Mother    Alzheimer's disease Father    Throat cancer Maternal Aunt        dx after 69    Hypertension Maternal Grandmother    Heart disease Maternal Grandmother    Hypertension Paternal Grandmother    Heart disease Paternal Grandmother     ROS: Constitutional: negative Genitourinary:negative  Exam:   BP (!) 140/71 (BP Location: Right Arm, Patient Position: Sitting, Cuff Size: Large)   Pulse 80   Ht '5\' 5"'$  (1.651 m) Comment: Reported  Wt 192 lb 9.6 oz (87.4 kg)   LMP 11/21/2006   BMI 32.05 kg/m   Height: '5\' 5"'$  (165.1 cm) (Reported)  General appearance: alert, cooperative and appears stated age Head: Normocephalic, without obvious abnormality, atraumatic Neck: no adenopathy, supple, symmetrical, trachea midline and thyroid normal to inspection and palpation Lungs: clear to auscultation bilaterally Breasts:  deep nodule in right breast about 5cm from areola, mildly tender, no LAD, no nipple discharge; left breast with no new masses/skin changes/LAD Heart: regular rate and rhythm Abdomen: soft, non-tender; bowel sounds normal; no masses,  no organomegaly Extremities: extremities normal, atraumatic, no cyanosis or edema Skin: Skin color, texture, turgor normal. No rashes or lesions Lymph nodes: Cervical, supraclavicular, and axillary nodes normal. No abnormal inguinal nodes palpated Neurologic: Grossly normal   Pelvic: External genitalia:  no lesions              Urethra:  normal appearing urethra with no masses, tenderness or lesions              Bartholins and Skenes: normal                 Vagina: normal appearing vagina with normal color and no discharge, no lesions              Cervix: no lesions              Pap taken: Yes.   Bimanual Exam:  Uterus:  normal size, contour, position, consistency, mobility, non-tender              Adnexa: normal adnexa and no mass, fullness, tenderness               Rectovaginal: Confirms               Anus:  normal sphincter tone, no lesions  Chaperone, Octaviano Batty, CMA, was present for exam.  Assessment/Plan: 1. Well woman  exam with routine gynecological exam - Pap smear obtained today - Mammogram completed today - Colonoscopy 02/04/2022 - Bone mineral density 06/28/2022 - lab work done with PCP, Dr. Kathyrn Lass - vaccines reviewed/updated  2. Cervical cancer screening - Cytology - PAP( [facility])  3. Mass of upper outer quadrant of right breast - discussed with pt proceeding with MRI now as diagnostic mammogram showed non abnormal finding  4. Personal history of breast cancer -  on left in 2008. S/p lumpectomy, chemo, radiation, anti-estrogen x 10 years - on right 2023.  S/p lumpectomy, radiation.  Oncotype testing did not show any benefit from chemotherapy.  On letrozole      5. Genetic testing -CHEK2 positive testing  6. Essential hypertension - on treatment

## 2022-10-24 ENCOUNTER — Telehealth (HOSPITAL_BASED_OUTPATIENT_CLINIC_OR_DEPARTMENT_OTHER): Payer: Self-pay | Admitting: Obstetrics & Gynecology

## 2022-10-24 NOTE — Telephone Encounter (Signed)
Patient called  and left message that she is returning Kim's call.

## 2022-10-25 LAB — CYTOLOGY - PAP
Comment: NEGATIVE
Diagnosis: NEGATIVE
High risk HPV: NEGATIVE

## 2022-11-15 ENCOUNTER — Other Ambulatory Visit: Payer: Self-pay | Admitting: *Deleted

## 2022-11-15 MED ORDER — LETROZOLE 2.5 MG PO TABS: ORAL_TABLET | ORAL | 3 refills | Status: DC

## 2022-11-16 ENCOUNTER — Other Ambulatory Visit: Payer: Self-pay | Admitting: *Deleted

## 2023-01-25 ENCOUNTER — Inpatient Hospital Stay: Payer: Medicare Other | Attending: Hematology and Oncology | Admitting: Hematology and Oncology

## 2023-01-25 VITALS — BP 131/73 | HR 83 | Temp 97.3°F | Resp 16 | Ht 65.0 in | Wt 197.3 lb

## 2023-01-25 DIAGNOSIS — M8580 Other specified disorders of bone density and structure, unspecified site: Secondary | ICD-10-CM | POA: Insufficient documentation

## 2023-01-25 DIAGNOSIS — Z79811 Long term (current) use of aromatase inhibitors: Secondary | ICD-10-CM | POA: Insufficient documentation

## 2023-01-25 DIAGNOSIS — Z923 Personal history of irradiation: Secondary | ICD-10-CM | POA: Diagnosis not present

## 2023-01-25 DIAGNOSIS — C50411 Malignant neoplasm of upper-outer quadrant of right female breast: Secondary | ICD-10-CM | POA: Diagnosis present

## 2023-01-25 DIAGNOSIS — Z79899 Other long term (current) drug therapy: Secondary | ICD-10-CM | POA: Diagnosis not present

## 2023-01-25 DIAGNOSIS — Z853 Personal history of malignant neoplasm of breast: Secondary | ICD-10-CM | POA: Diagnosis not present

## 2023-01-25 DIAGNOSIS — Z170 Estrogen receptor positive status [ER+]: Secondary | ICD-10-CM | POA: Diagnosis not present

## 2023-01-25 DIAGNOSIS — Z9221 Personal history of antineoplastic chemotherapy: Secondary | ICD-10-CM | POA: Diagnosis not present

## 2023-01-25 NOTE — Assessment & Plan Note (Signed)
Marilyn Hansen is a pleasant 67 y.o. female with history of Stage IIA left breast invasive ductal carcinoma, ER+/PR+/HER2-, diagnosed in 09/2006, treated with lumpectomy, adjuvant chemotherapy, adjuvant radiation therapy, and anti-estrogen therapy with Tamoxifen, followed by Letrozole completed in 06/2018.    Given small tumor with favorable prognostics we have discussed about upfront surgery followed by Oncotype testing.  She underwent right partial mastectomy on 01/13/2022, final pathology showing 1.4 cm invasive ductal carcinoma, all surgical margins negative, no evidence of lymph node involvement.  Oncotype resulted at 7, distant risk of recurrence at 9 years with antiestrogen therapy alone is 3%, no benefit from chemotherapy She completed adjuvant radiation and she is now on Femara for adjuvant antiestrogen therapy.  Baseline bone density with osteopenia.  She alternates between MRIs and mammograms given her CHEK2 pathogenic mutation.  On exam today, no concerns in breasts or bilateral regional adenopathy.  She does point to an area of tenderness at the outer edge of the right breast which is likely skeletal, I wonder if she could have any insufficiency fractures from adjuvant radiation however she has been recovering and the pain has been better since February of this year.  I have encouraged her to reach out back to [place] if the pain gets worse and she expressed understanding.  The she will have her MRI in August 2024, continue letrozole, repeat bone density in November 2025.  I have encouraged her to walk 30 minutes a day 5 days a week, lift some weights for bone density improvement as well as continue to take vitamin D and calcium supplementation as tolerated.

## 2023-01-25 NOTE — Progress Notes (Signed)
REASON FOR VISIT:  Routine follow-up for history of breast cancer.   BRIEF ONCOLOGIC HISTORY:  Oncology History  Breast cancer of upper-outer quadrant of left female breast (HCC)  09/29/2006 Surgery   Left breast lumpectomy: Invasive ductal carcinoma 2.6 cm grade 21 SLN positive for micrometastatic disease 9 additional lymph nodes were negative, ER 99%, PR 90%, HER-2 1+, Ki-67 12% Oncotype DX intermediate risk    Procedure   BRCA1 and 2 testing was done and it was negative   11/30/2006 - 04/01/2007 Chemotherapy   Taxotere Cytoxan x4    Radiation Therapy   Adjuvant radiation therapy   09/01/2007 - 07/13/2018 Anti-estrogen oral therapy   Tamoxifen from 2009 to 2011 then switched to Letrozole 2.5 mg daily    01/2022 -  Anti-estrogen oral therapy   Letrozole   03/21/2022 - 04/18/2022 Radiation Therapy   Site Technique Total Dose (Gy) Dose per Fx (Gy) Completed Fx Beam Energies  Breast, Right: Breast_R 3D 40.05/40.05 2.67 15/15 10XFFF  Breast, Right: Breast_R_Bst 3D 10/10 2 5/5 6X, 10X     Malignant neoplasm of upper-outer quadrant of right breast in female, estrogen receptor positive (HCC)  10/19/2021 Mammogram   Mammogram from February 2023 showed a possible mass as well as separate area of possible distortion in the right breast.  No suspicious findings in the left breast.  Right breast mammogram showed possible subtle distortion within the slightly outer right breast.  Suspicious 1 cm upper outer right breast mass.  Ultrasound-guided biopsy is recommended.  No abnormal appearing right axillary lymph nodes.   11/29/2021 Pathology Results   Pathology showed invasive ductal carcinoma, grade 2 along with DCIS in the right breast needle core biopsy at 11 o'clock position.  Biopsy from the right breast upper outer quadrant showed complex sclerosing lesion with usual ductal hyperplasia.  Fibrocystic changes with usual ductal hyperplasia.  Prognostics from the tumor show ER 100% positive strong  staining intensity, PR 100% positive, strong staining intensity, Ki-67 of 10% and HER2 equivocal, preliminary FISH was reported negative   12/03/2021 Initial Diagnosis   Malignant neoplasm of upper-outer quadrant of right breast in female, estrogen receptor positive (HCC)   12/08/2021 Cancer Staging   Staging form: Breast, AJCC 8th Edition - Clinical stage from 12/08/2021: Stage IA (cT1b, cN0, cM0, G2, ER+, PR+, HER2-) - Signed by Rachel Moulds, MD on 12/08/2021 Stage prefix: Initial diagnosis Histologic grading system: 3 grade system   12/14/2021 Genetic Testing   Pathogenic variant in CHEK2 gene at  c.1100delC ([id number]).  Report date is December 14, 2021.   No other pathogenic variants detected in [facility]CAP[facility] Panel.  The BRCAplus panel offered by [employer] and includes sequencing and deletion/duplication analysis for the following 8 genes: ATM, BRCA1, BRCA2, CDH1, CHEK2, PALB2, PTEN, and TP53. Results of pan-cancer panel are pending.     Oncotype testing   Oncotype was delayed because of insufficient sample.  This was however recent and the score resulted at 7.  Distant risk of recurrence at 9 years with antiestrogen therapy alone is 3%, no benefit from chemotherapy   01/13/2022 Surgery   She underwent right breast lumpectomy which showed focal atypical lobular hyperplasia, invasive and in situ ductal carcinoma measuring 1.4 cm, all surgical margins negative, no evidence of lymph node involvement.   01/2022 -  Anti-estrogen oral therapy   Letrozole   03/21/2022 - 04/18/2022 Radiation Therapy   Site Technique Total Dose (Gy) Dose per Fx (Gy) Completed Fx Beam Energies  Breast,  Right: Breast_R 3D 40.05/40.05 2.67 15/15 10XFFF  Breast, Right: Breast_R_Bst 3D 10/10 2 5/5 6X, 10X       INTERVAL HISTORY:   Patient is here for follow-up.  She is doing quite well on letrozole.  Since her last visit here, she has noticed the right chest wall pain happened to talk to her gynecologist in  February had a mammogram and ultrasound with no concerns.  She continues to have this pain right at the outer edge of the breast but has definitely subsided compared to February.  She does not remember injuring this.  This may have started after radiation.  She is otherwise trying to walk regularly, exercise, takes vitamin D supplementation and loves milk and milk-based products.  She denies any changes in breathing, no change in bowel habits or urinary habits.  No new neurological complaints.  Rest of the pertinent 10 point ROS reviewed and negative  REVIEW OF SYSTEMS:  Review of Systems  Constitutional:  Negative for appetite change, chills, fatigue, fever and unexpected weight change.  HENT:   Negative for hearing loss, lump/mass, sore throat and trouble swallowing.   Eyes:  Negative for eye problems and icterus.  Respiratory:  Negative for chest tightness, cough, hemoptysis and shortness of breath.   Cardiovascular:  Negative for chest pain, leg swelling and palpitations.  Gastrointestinal:  Negative for abdominal distention, abdominal pain, constipation, diarrhea, nausea and vomiting.  Endocrine: Negative for hot flashes.  Genitourinary:  Negative for difficulty urinating.   Musculoskeletal:  Negative for arthralgias.  Skin:  Negative for itching and rash.  Neurological:  Negative for dizziness, extremity weakness, headaches and numbness.  Hematological:  Negative for adenopathy. Does not bruise/bleed easily.  Psychiatric/Behavioral:  Negative for depression. The patient is not nervous/anxious.     PAST MEDICAL/SURGICAL HISTORY:  Past Medical History:  Diagnosis Date   Anxiety    Breast cancer (HCC) 08/2006   invasive breast cancer (T2, N1 ER+/PR+) BRCA 1/11 negative   Breast cancer ([facility]) 12/2021   right breast IDC with DCIS   Depression    H/O left breast biopsy 09/2005   intraductal papilloma with atypical ductal hyperplasia   Hyperlipemia    Hypertension    Monoallelic  mutation of CHEK2 gene in female patient 12/15/2021   Personal history of breast cancer 12/09/2021   Personal history of chemotherapy 2008   Personal history of radiation therapy 2008   PONV (postoperative nausea and vomiting)    Past Surgical History:  Procedure Laterality Date   AUGMENTATION MAMMAPLASTY Bilateral 2003   BILATERAL SALPINGOOPHORECTOMY  6/10   l   BREAST BIOPSY  2/07   breast excisional biopsy/chemo   BREAST BIOPSY  1/14 or 2/14   right-benign   BREAST ENHANCEMENT SURGERY  2003   saline   BREAST LUMPECTOMY Left 2008   BREAST LUMPECTOMY WITH RADIOACTIVE SEED AND SENTINEL LYMPH NODE BIOPSY Right 01/13/2022   Procedure: RIGHT BREAST LUMPECTOMY WITH RADIOACTIVE SEED X 2 AND SENTINEL LYMPH NODE BIOPSY;  Surgeon: Abigail Miyamoto, MD;  Location: [facility];  Service: General;  Laterality: Right;   EXTRACORPOREAL SHOCK WAVE LITHOTRIPSY Left 08/11/2022   Procedure: EXTRACORPOREAL SHOCK WAVE LITHOTRIPSY (ESWL);  Surgeon: Alfredo Martinez, MD;  Location[facility];  Service: Urology;  Laterality: Left;   REDUCTION MAMMAPLASTY Bilateral 2009   WISDOM TOOTH EXTRACTION       ALLERGIES:  Allergies  Allergen Reactions   Dayquil [Pseudoephedrine-Apap-Dm] Hives   Lisinopril Cough     CURRENT MEDICATIONS:  Outpatient Encounter Medications as of 01/25/2023  Medication Sig Note   atorvastatin (LIPITOR) 40 MG tablet Take 40 mg by mouth daily.    Cholecalciferol (VITAMIN D3) 1.25 MG (50000 UT) CAPS Take 50 mcg by mouth at bedtime.    escitalopram (LEXAPRO) 10 MG tablet 10 mg daily.    hydrochlorothiazide (HYDRODIURIL) 25 MG tablet Take 25 mg by mouth daily.  09/05/2016: Received from: External Pharmacy   letrozole (FEMARA) 2.5 MG tablet TAKE 1 TABLET(2.5 MG) BY MOUTH DAILY    traZODone (DESYREL) 50 MG tablet Take 50 mg by mouth at bedtime as needed for sleep. Takes 1/2 tab prn.    vitamin C (ASCORBIC ACID) 250 MG tablet Take 250 mg by mouth 2 (two)  times daily. Gummies.    zinc gluconate 50 MG tablet Take 50 mg by mouth daily.    No facility-administered encounter medications on file as of 01/25/2023.     ONCOLOGIC FAMILY HISTORY:  Family History  Problem Relation Age of Onset   Anemia Mother    Osteoporosis Mother    Alcohol abuse Mother    Alzheimer's disease Father    Throat cancer Maternal Aunt        dx after 50   Hypertension Maternal Grandmother    Heart disease Maternal Grandmother    Hypertension Paternal Grandmother    Heart disease Paternal Grandmother     GENETIC COUNSELING/TESTING: negative  SOCIAL HISTORY:  Social History   Socioeconomic History   Marital status: Married    Spouse name: Not on file   Number of children: Not on file   Years of education: Not on file   Highest education level: Not on file  Occupational History   Not on file  Tobacco Use   Smoking status: Never   Smokeless tobacco: Never  Vaping Use   Vaping Use: Never used  Substance and Sexual Activity   Alcohol use: Not Currently    Comment: social   Drug use: No   Sexual activity: Not Currently    Partners: Male    Birth control/protection: Post-menopausal  Other Topics Concern   Not on file  Social History Narrative   Not on file   Social Determinants of Health   Financial Resource Strain: Not on file  Food Insecurity: Not on file  Transportation Needs: Not on file  Physical Activity: Not on file  Stress: Not on file  Social Connections: Not on file  Intimate Partner Violence: Not on file      OBJECTIVE:  BP 131/73 (BP Location: Left Arm, Patient Position: Sitting)   Pulse 83   Temp (!) 97.3 F (36.3 C) (Temporal)   Resp 16   Ht 5\' 5"  (1.651 m)   Wt 197 lb 4.8 oz (89.5 kg)   LMP 11/21/2006   SpO2 98%   BMI 32.83 kg/m   Physical Exam Constitutional:      Appearance: Normal appearance.  Chest:     Comments: Bilateral breasts inspected.  The pain that she points to is in the outer edge of the right  breast.  No palpable abnormality.  Both breasts otherwise with no palpable concerns.  No regional adenopathy. Musculoskeletal:        General: No swelling.     Cervical back: Normal range of motion and neck supple. No rigidity.  Lymphadenopathy:     Cervical: No cervical adenopathy.  Skin:    General: Skin is warm and dry.  Neurological:     Mental Status: She is  alert.  Psychiatric:        Mood and Affect: Mood normal.    LABORATORY DATA:  None for this visit    ASSESSMENT AND PLAN:   Malignant neoplasm of upper-outer quadrant of right breast in female, estrogen receptor positive (HCC) Ms.Marilyn Hansen is a pleasant 67 y.o. female with history of Stage IIA left breast invasive ductal carcinoma, ER+/PR+/HER2-, diagnosed in 09/2006, treated with lumpectomy, adjuvant chemotherapy, adjuvant radiation therapy, and anti-estrogen therapy with Tamoxifen, followed by Letrozole completed in 06/2018.    Given small tumor with favorable prognostics we have discussed about upfront surgery followed by Oncotype testing.  She underwent right partial mastectomy on 01/13/2022, final pathology showing 1.4 cm invasive ductal carcinoma, all surgical margins negative, no evidence of lymph node involvement.  Oncotype resulted at 7, distant risk of recurrence at 9 years with antiestrogen therapy alone is 3%, no benefit from chemotherapy She completed adjuvant radiation and she is now on Femara for adjuvant antiestrogen therapy.  Baseline bone density with osteopenia.  She alternates between MRIs and mammograms given her CHEK2 pathogenic mutation.  On exam today, no concerns in breasts or bilateral regional adenopathy.  She does point to an area of tenderness at the outer edge of the right breast which is likely skeletal, I wonder if she could have any insufficiency fractures from adjuvant radiation however she has been recovering and the pain has been better since February of this year.  I have encouraged her to reach  out back to [place] if the pain gets worse and she expressed understanding.  The she will have her MRI in August 2024, continue letrozole, repeat bone density in November 2025.  I have encouraged her to walk 30 minutes a day 5 days a week, lift some weights for bone density improvement as well as continue to take vitamin D and calcium supplementation as tolerated.  Total time spent: 30 minutes including history, physical exam, review of records, counseling and coordination of care  Thank you for consulting [place] the care of this patient.  Please not hesitate contact us with any additional questions or concerns.  Rachel Moulds MD

## 2023-03-07 ENCOUNTER — Telehealth: Payer: Self-pay | Admitting: Hematology and Oncology

## 2023-03-07 NOTE — Telephone Encounter (Signed)
Called patient to get rescheduled. Left patient vm to call back and get rescheduled

## 2023-03-09 ENCOUNTER — Telehealth: Payer: Self-pay | Admitting: Hematology and Oncology

## 2023-03-09 NOTE — Telephone Encounter (Signed)
Patient is aware of rescheduled appointment times/dates 

## 2023-03-29 ENCOUNTER — Ambulatory Visit (HOSPITAL_COMMUNITY)
Admission: RE | Admit: 2023-03-29 | Discharge: 2023-03-29 | Disposition: A | Discharge status: Home / Self Care | Payer: Medicare Other | Source: Ambulatory Visit | Attending: Hematology and Oncology | Admitting: Hematology and Oncology

## 2023-03-29 DIAGNOSIS — Z170 Estrogen receptor positive status [ER+]: Secondary | ICD-10-CM | POA: Insufficient documentation

## 2023-03-29 DIAGNOSIS — C50411 Malignant neoplasm of upper-outer quadrant of right female breast: Secondary | ICD-10-CM | POA: Diagnosis not present

## 2023-03-29 MED ORDER — GADOBUTROL 1 MMOL/ML IV SOLN
10.0000 mL | Freq: Once | INTRAVENOUS | Status: AC | PRN
  Administered 2023-03-29: 10 mL via INTRAVENOUS

## 2023-04-27 ENCOUNTER — Other Ambulatory Visit: Payer: Self-pay | Admitting: Family Medicine

## 2023-04-27 DIAGNOSIS — I77810 Thoracic aortic ectasia: Secondary | ICD-10-CM

## 2023-06-14 ENCOUNTER — Telehealth: Payer: Self-pay | Admitting: Hematology and Oncology

## 2023-06-14 NOTE — Telephone Encounter (Signed)
Patient is aware of rescheduled appointment time/dates for follow up due to [place] being out of office on 12-11 to 12/13

## 2023-06-19 ENCOUNTER — Ambulatory Visit
Admission: RE | Admit: 2023-06-19 | Discharge: 2023-06-19 | Disposition: A | Discharge status: Home / Self Care | Payer: Medicare Other | Source: Ambulatory Visit | Attending: Family Medicine | Admitting: Family Medicine

## 2023-06-19 DIAGNOSIS — I77810 Thoracic aortic ectasia: Secondary | ICD-10-CM

## 2023-06-19 MED ORDER — GADOPICLENOL 0.5 MMOL/ML IV SOLN
9.0000 mL | Freq: Once | INTRAVENOUS | Status: AC | PRN
  Administered 2023-06-19: 9 mL via INTRAVENOUS

## 2023-07-27 ENCOUNTER — Ambulatory Visit: Payer: Medicare Other | Admitting: Hematology and Oncology

## 2023-08-02 ENCOUNTER — Ambulatory Visit: Payer: Medicare Other | Admitting: Hematology and Oncology

## 2023-08-08 ENCOUNTER — Inpatient Hospital Stay: Payer: Medicare Other | Attending: Hematology and Oncology | Admitting: Hematology and Oncology

## 2023-08-08 VITALS — BP 145/63 | HR 87 | Temp 98.0°F | Resp 16 | Wt 195.5 lb

## 2023-08-08 DIAGNOSIS — C50411 Malignant neoplasm of upper-outer quadrant of right female breast: Secondary | ICD-10-CM | POA: Diagnosis present

## 2023-08-08 DIAGNOSIS — N200 Calculus of kidney: Secondary | ICD-10-CM | POA: Diagnosis not present

## 2023-08-08 DIAGNOSIS — Z923 Personal history of irradiation: Secondary | ICD-10-CM | POA: Insufficient documentation

## 2023-08-08 DIAGNOSIS — C50412 Malignant neoplasm of upper-outer quadrant of left female breast: Secondary | ICD-10-CM | POA: Diagnosis not present

## 2023-08-08 DIAGNOSIS — Z79899 Other long term (current) drug therapy: Secondary | ICD-10-CM | POA: Insufficient documentation

## 2023-08-08 DIAGNOSIS — Z808 Family history of malignant neoplasm of other organs or systems: Secondary | ICD-10-CM | POA: Insufficient documentation

## 2023-08-08 DIAGNOSIS — Z853 Personal history of malignant neoplasm of breast: Secondary | ICD-10-CM | POA: Diagnosis not present

## 2023-08-08 DIAGNOSIS — Z9221 Personal history of antineoplastic chemotherapy: Secondary | ICD-10-CM | POA: Diagnosis not present

## 2023-08-08 DIAGNOSIS — M8580 Other specified disorders of bone density and structure, unspecified site: Secondary | ICD-10-CM | POA: Insufficient documentation

## 2023-08-08 DIAGNOSIS — Z170 Estrogen receptor positive status [ER+]: Secondary | ICD-10-CM | POA: Diagnosis not present

## 2023-08-08 DIAGNOSIS — Z79811 Long term (current) use of aromatase inhibitors: Secondary | ICD-10-CM | POA: Insufficient documentation

## 2023-08-08 MED ORDER — LETROZOLE 2.5 MG PO TABS: ORAL_TABLET | ORAL | 3 refills | Status: DC

## 2023-08-08 NOTE — Assessment & Plan Note (Addendum)
Marilyn Hansen is a pleasant 67 y.o. female with history of Stage IIA left breast invasive ductal carcinoma, ER+/PR+/HER2-, diagnosed in 09/2006, treated with lumpectomy, adjuvant chemotherapy, adjuvant radiation therapy, and anti-estrogen therapy with Tamoxifen, followed by Letrozole completed in 06/2018.     Given small tumor with favorable prognostics we have discussed about upfront surgery followed by Oncotype testing.  Since then she underwent right partial mastectomy on 01/13/2022, final pathology showing 1.4 cm invasive ductal carcinoma, all surgical margins negative, no evidence of lymph node involvement.  Oncotype resulted at 7, distant risk of recurrence at 9 years with antiestrogen therapy alone is 3%, no benefit from chemotherapy She completed adjuvant radiation and she is now on Femara for adjuvant antiestrogen therapy.  Baseline bone density with osteopenia.  She alternates between MRIs and mammograms given her CHEK2 pathogenic mutation.  Breast Cancer (BRCA2 mutation) No new changes in breasts. Scar tissue present from previous surgery. MRI in August showed no new findings, but right breast imaging was limited due to mag tracer. -Continue Letrozole as prescribed. -Order mammogram for March 2025. -Order MRI for August 2025.  Kidney Stone Recent episode of kidney stone with unusual presentation (hematuria without pain). No known triggers or risk factors identified. -Advised to continue hydration and stone analysis in case of recurrence.  General Health Maintenance -Annual follow-up planned for next year.

## 2023-08-08 NOTE — Progress Notes (Signed)
REASON FOR VISIT:  Routine follow-up for history of breast cancer.   BRIEF ONCOLOGIC HISTORY:  Oncology History  Breast cancer of upper-outer quadrant of left female breast (HCC)  09/29/2006 Surgery   Left breast lumpectomy: Invasive ductal carcinoma 2.6 cm grade 21 SLN positive for micrometastatic disease 9 additional lymph nodes were negative, ER 99%, PR 90%, HER-2 1+, Ki-67 12% Oncotype DX intermediate risk    Procedure   BRCA1 and 2 testing was done and it was negative   11/30/2006 - 04/01/2007 Chemotherapy   Taxotere Cytoxan x4    Radiation Therapy   Adjuvant radiation therapy   09/01/2007 - 07/13/2018 Anti-estrogen oral therapy   Tamoxifen from 2009 to 2011 then switched to Letrozole 2.5 mg daily    01/2022 -  Anti-estrogen oral therapy   Letrozole   03/21/2022 - 04/18/2022 Radiation Therapy   Site Technique Total Dose (Gy) Dose per Fx (Gy) Completed Fx Beam Energies  Breast, Right: Breast_R 3D 40.05/40.05 2.67 15/15 10XFFF  Breast, Right: Breast_R_Bst 3D 10/10 2 5/5 6X, 10X     Malignant neoplasm of upper-outer quadrant of right breast in female, estrogen receptor positive (HCC)  10/19/2021 Mammogram   Mammogram from February 2023 showed a possible mass as well as separate area of possible distortion in the right breast.  No suspicious findings in the left breast.  Right breast mammogram showed possible subtle distortion within the slightly outer right breast.  Suspicious 1 cm upper outer right breast mass.  Ultrasound-guided biopsy is recommended.  No abnormal appearing right axillary lymph nodes.   11/29/2021 Pathology Results   Pathology showed invasive ductal carcinoma, grade 2 along with DCIS in the right breast needle core biopsy at 11 o'clock position.  Biopsy from the right breast upper outer quadrant showed complex sclerosing lesion with usual ductal hyperplasia.  Fibrocystic changes with usual ductal hyperplasia.  Prognostics from the tumor show ER 100% positive strong  staining intensity, PR 100% positive, strong staining intensity, Ki-67 of 10% and HER2 equivocal, preliminary FISH was reported negative   12/03/2021 Initial Diagnosis   Malignant neoplasm of upper-outer quadrant of right breast in female, estrogen receptor positive (HCC)   12/08/2021 Cancer Staging   Staging form: Breast, AJCC 8th Edition - Clinical stage from 12/08/2021: Stage IA (cT1b, cN0, cM0, G2, ER+, PR+, HER2-) - Signed by Rachel Moulds, MD on 12/08/2021 Stage prefix: Initial diagnosis Histologic grading system: 3 grade system   12/14/2021 Genetic Testing   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  Report date is December 14, 2021.   No other pathogenic variants detected in [facility] Panel.  The BRCAplus panel offered by [employer] and includes sequencing and deletion/duplication analysis for the following 8 genes: ATM, BRCA1, BRCA2, CDH1, CHEK2, PALB2, PTEN, and TP53. Results of pan-cancer panel are pending.     Oncotype testing   Oncotype was delayed because of insufficient sample.  This was however recent and the score resulted at 7.  Distant risk of recurrence at 9 years with antiestrogen therapy alone is 3%, no benefit from chemotherapy   01/13/2022 Surgery   She underwent right breast lumpectomy which showed focal atypical lobular hyperplasia, invasive and in situ ductal carcinoma measuring 1.4 cm, all surgical margins negative, no evidence of lymph node involvement.   01/2022 -  Anti-estrogen oral therapy   Letrozole   03/21/2022 - 04/18/2022 Radiation Therapy   Site Technique Total Dose (Gy) Dose per Fx (Gy) Completed Fx Beam Energies  Breast,  Right: Breast_R 3D 40.05/40.05 2.67 15/15 10XFFF  Breast, Right: Breast_R_Bst 3D 10/10 2 5/5 6X, 10X       INTERVAL HISTORY:   Discussed the use of AI scribe software for clinical note transcription with the patient, who gave verbal consent to proceed.  History of Present Illness    The patient, with  a history of breast cancer and a CHEK2 mutation, presents for a routine follow-up. She reports no issues with her current medication, letrozole. She has been alternating between mammograms and MRIs for monitoring, with the most recent MRI in August showing no concerning findings. However, the MRI was limited in the right breast due to the presence of a mag tracer from a previous sentinel lymph node procedure. The patient also reports a recent episode of a kidney stone, which was unusual in that she experienced no pain until the stone was confirmed and needed to be removed. She denies any changes in diet, medication, or hydration that could have contributed to the stone. The patient also mentions a previous issue with swelling in her foot, which has since resolved and was attributed to increased physical activity.  Rest of the pertinent 10 point ROS reviewed and negative  REVIEW OF SYSTEMS:  Review of Systems  Constitutional:  Negative for appetite change, chills, fatigue, fever and unexpected weight change.  HENT:   Negative for hearing loss, lump/mass, sore throat and trouble swallowing.   Eyes:  Negative for eye problems and icterus.  Respiratory:  Negative for chest tightness, cough, hemoptysis and shortness of breath.   Cardiovascular:  Negative for chest pain, leg swelling and palpitations.  Gastrointestinal:  Negative for abdominal distention, abdominal pain, constipation, diarrhea, nausea and vomiting.  Endocrine: Negative for hot flashes.  Genitourinary:  Negative for difficulty urinating.   Musculoskeletal:  Negative for arthralgias.  Skin:  Negative for itching and rash.  Neurological:  Negative for dizziness, extremity weakness, headaches and numbness.  Hematological:  Negative for adenopathy. Does not bruise/bleed easily.  Psychiatric/Behavioral:  Negative for depression. The patient is not nervous/anxious.     PAST MEDICAL/SURGICAL HISTORY:  Past Medical History:  Diagnosis Date    Anxiety    Breast cancer (HCC) 08/2006   invasive breast cancer (T2, N1 ER+/PR+) BRCA 1/11 negative   Breast cancer (HCC) 12/2021   right breast IDC with DCIS   Depression    H/O left breast biopsy 09/2005   intraductal papilloma with atypical ductal hyperplasia   Hyperlipemia    Hypertension    Monoallelic mutation of CHEK2 gene in female patient 12/15/2021   Personal history of breast cancer 12/09/2021   Personal history of chemotherapy 2008   Personal history of radiation therapy 2008   PONV (postoperative nausea and vomiting)    Past Surgical History:  Procedure Laterality Date   AUGMENTATION MAMMAPLASTY Bilateral 2003   BILATERAL SALPINGOOPHORECTOMY  6/10   l   BREAST BIOPSY  2/07   breast excisional biopsy/chemo   BREAST BIOPSY  1/14 or 2/14   right-benign   BREAST ENHANCEMENT SURGERY  2003   saline   BREAST LUMPECTOMY Left 2008   BREAST LUMPECTOMY WITH RADIOACTIVE SEED AND SENTINEL LYMPH NODE BIOPSY Right 01/13/2022   Procedure: RIGHT BREAST LUMPECTOMY WITH RADIOACTIVE SEED X 2 AND SENTINEL LYMPH NODE BIOPSY;  Surgeon: Abigail Miyamoto, MD;  Location: [facility];  Service: General;  Laterality: Right;   EXTRACORPOREAL SHOCK WAVE LITHOTRIPSY Left 08/11/2022   Procedure: EXTRACORPOREAL SHOCK WAVE LITHOTRIPSY (ESWL);  Surgeon: Alfredo Martinez, MD;  Location: [facility];  Service: Urology;  Laterality: Left;   REDUCTION MAMMAPLASTY Bilateral 2009   WISDOM TOOTH EXTRACTION       ALLERGIES:  Allergies  Allergen Reactions   Dayquil [Pseudoephedrine-Apap-Dm] Hives   Lisinopril Cough     CURRENT MEDICATIONS:  Outpatient Encounter Medications as of 08/08/2023  Medication Sig Note   atorvastatin (LIPITOR) 40 MG tablet Take 40 mg by mouth daily.    Cholecalciferol (VITAMIN D3) 1.25 MG (50000 UT) CAPS Take 50 mcg by mouth at bedtime.    escitalopram (LEXAPRO) 10 MG tablet 10 mg daily.    hydrochlorothiazide (HYDRODIURIL) 25 MG tablet  Take 25 mg by mouth daily.  09/05/2016: Received from: External Pharmacy   letrozole (FEMARA) 2.5 MG tablet TAKE 1 TABLET(2.5 MG) BY MOUTH DAILY    traZODone (DESYREL) 50 MG tablet Take 50 mg by mouth at bedtime as needed for sleep. Takes 1/2 tab prn.    vitamin C (ASCORBIC ACID) 250 MG tablet Take 250 mg by mouth 2 (two) times daily. Gummies.    zinc gluconate 50 MG tablet Take 50 mg by mouth daily.    [DISCONTINUED] letrozole (FEMARA) 2.5 MG tablet TAKE 1 TABLET(2.5 MG) BY MOUTH DAILY    No facility-administered encounter medications on file as of 08/08/2023.     ONCOLOGIC FAMILY HISTORY:  Family History  Problem Relation Age of Onset   Anemia Mother    Osteoporosis Mother    Alcohol abuse Mother    Alzheimer's disease Father    Throat cancer Maternal Aunt        dx after 50   Hypertension Maternal Grandmother    Heart disease Maternal Grandmother    Hypertension Paternal Grandmother    Heart disease Paternal Grandmother     GENETIC COUNSELING/TESTING: negative  SOCIAL HISTORY:  Social History   Socioeconomic History   Marital status: Married    Spouse name: Not on file   Number of children: Not on file   Years of education: Not on file   Highest education level: Not on file  Occupational History   Not on file  Tobacco Use   Smoking status: Never   Smokeless tobacco: Never  Vaping Use   Vaping status: Never Used  Substance and Sexual Activity   Alcohol use: Not Currently    Comment: social   Drug use: No   Sexual activity: Not Currently    Partners: Male    Birth control/protection: Post-menopausal  Other Topics Concern   Not on file  Social History Narrative   Not on file   Social Drivers of Health   Financial Resource Strain: Not on file  Food Insecurity: Not on file  Transportation Needs: Not on file  Physical Activity: Not on file  Stress: Not on file  Social Connections: Not on file  Intimate Partner Violence: Not on file      OBJECTIVE:  BP  (!) 145/63 (BP Location: Left Arm, Patient Position: Sitting)   Pulse 87   Temp 98 F (36.7 C) (Temporal)   Resp 16   Wt 195 lb 8 oz (88.7 kg)   LMP 11/21/2006   SpO2 100%   BMI 32.53 kg/m   Physical Exam Constitutional:      Appearance: Normal appearance.  Chest:     Comments: Bilateral breasts inspected. No palpable abnormality.  Both breasts otherwise with no palpable concerns.  No regional adenopathy. Musculoskeletal:        General: No swelling.  Cervical back: Normal range of motion and neck supple. No rigidity.  Lymphadenopathy:     Cervical: No cervical adenopathy.  Skin:    General: Skin is warm and dry.  Neurological:     Mental Status: She is alert.  Psychiatric:        Mood and Affect: Mood normal.    LABORATORY DATA:  None for this visit    ASSESSMENT AND PLAN:   Breast cancer of upper-outer quadrant of left female breast [facility]) Ms Wadhwani is a pleasant 67 y.o. female with history of Stage IIA left breast invasive ductal carcinoma, ER+/PR+/HER2-, diagnosed in 09/2006, treated with lumpectomy, adjuvant chemotherapy, adjuvant radiation therapy, and anti-estrogen therapy with Tamoxifen, followed by Letrozole completed in 06/2018.     Given small tumor with favorable prognostics we have discussed about upfront surgery followed by Oncotype testing.  Since then she underwent right partial mastectomy on 01/13/2022, final pathology showing 1.4 cm invasive ductal carcinoma, all surgical margins negative, no evidence of lymph node involvement.  Oncotype resulted at 7, distant risk of recurrence at 9 years with antiestrogen therapy alone is 3%, no benefit from chemotherapy She completed adjuvant radiation and she is now on Femara for adjuvant antiestrogen therapy.  Baseline bone density with osteopenia.  She alternates between MRIs and mammograms given her CHEK2 pathogenic mutation.  Breast Cancer (BRCA2 mutation) No new changes in breasts. Scar tissue present from  previous surgery. MRI in August showed no new findings, but right breast imaging was limited due to mag tracer. -Continue Letrozole as prescribed. -Order mammogram for March 2025. -Order MRI for August 2025.  Kidney Stone Recent episode of kidney stone with unusual presentation (hematuria without pain). No known triggers or risk factors identified. -Advised to continue hydration and stone analysis in case of recurrence.  General Health Maintenance -Annual follow-up planned for next year.    Total time spent: 30 minutes including history, physical exam, review of records, counseling and coordination of care  Thank you for consulting [place] the care of this patient.  Please not hesitate contact us with any additional questions or concerns.  Rachel Moulds MD

## 2023-09-18 ENCOUNTER — Other Ambulatory Visit: Payer: Self-pay | Admitting: Hematology and Oncology

## 2023-10-23 ENCOUNTER — Ambulatory Visit
Admission: RE | Admit: 2023-10-23 | Discharge: 2023-10-23 | Disposition: A | Discharge status: Home / Self Care | Payer: Medicare Other | Source: Ambulatory Visit | Attending: Hematology and Oncology

## 2023-10-23 DIAGNOSIS — C50412 Malignant neoplasm of upper-outer quadrant of left female breast: Secondary | ICD-10-CM

## 2024-04-01 ENCOUNTER — Ambulatory Visit
Admission: RE | Admit: 2024-04-01 | Discharge: 2024-04-01 | Disposition: A | Discharge status: Home / Self Care | Source: Ambulatory Visit | Attending: Hematology and Oncology | Admitting: Hematology and Oncology

## 2024-04-01 DIAGNOSIS — Z170 Estrogen receptor positive status [ER+]: Secondary | ICD-10-CM

## 2024-04-01 MED ORDER — GADOPICLENOL 0.5 MMOL/ML IV SOLN
7.5000 mL | Freq: Once | INTRAVENOUS | Status: AC | PRN
  Administered 2024-04-01 (×2): 7.5 mL via INTRAVENOUS

## 2024-04-05 ENCOUNTER — Telehealth: Payer: Self-pay | Admitting: *Deleted

## 2024-04-05 NOTE — Telephone Encounter (Signed)
 This RN spoke with pt per her call stating she has multiple concerns with changes in her breast- she states she thinks it may be scar tissue but there are areas she feels that wakes her up at night on the outer side of her breast. Like a sensation She states she has also developed a rash which does not irritate her is intermittently itchy but I read that could be a sign of cancer   Per above- appt made for MD.

## 2024-04-08 ENCOUNTER — Inpatient Hospital Stay: Attending: Hematology and Oncology | Admitting: Hematology and Oncology

## 2024-04-08 VITALS — BP 139/59 | HR 76 | Temp 97.3°F | Resp 19 | Wt 188.6 lb

## 2024-04-08 DIAGNOSIS — Z1721 Progesterone receptor positive status: Secondary | ICD-10-CM | POA: Insufficient documentation

## 2024-04-08 DIAGNOSIS — C50412 Malignant neoplasm of upper-outer quadrant of left female breast: Secondary | ICD-10-CM | POA: Diagnosis not present

## 2024-04-08 DIAGNOSIS — M8580 Other specified disorders of bone density and structure, unspecified site: Secondary | ICD-10-CM | POA: Insufficient documentation

## 2024-04-08 DIAGNOSIS — Z853 Personal history of malignant neoplasm of breast: Secondary | ICD-10-CM | POA: Diagnosis not present

## 2024-04-08 DIAGNOSIS — Z1732 Human epidermal growth factor receptor 2 negative status: Secondary | ICD-10-CM | POA: Insufficient documentation

## 2024-04-08 DIAGNOSIS — Z79811 Long term (current) use of aromatase inhibitors: Secondary | ICD-10-CM | POA: Diagnosis not present

## 2024-04-08 DIAGNOSIS — Z9221 Personal history of antineoplastic chemotherapy: Secondary | ICD-10-CM | POA: Diagnosis not present

## 2024-04-08 DIAGNOSIS — Z923 Personal history of irradiation: Secondary | ICD-10-CM | POA: Diagnosis not present

## 2024-04-08 DIAGNOSIS — Z170 Estrogen receptor positive status [ER+]: Secondary | ICD-10-CM | POA: Diagnosis not present

## 2024-04-08 NOTE — Progress Notes (Signed)
 REASON FOR VISIT:  Routine follow-up for history of breast cancer.   BRIEF ONCOLOGIC HISTORY:  Oncology History  Breast cancer of upper-outer quadrant of left female breast (HCC)  09/29/2006 Surgery   Left breast lumpectomy: Invasive ductal carcinoma 2.6 cm grade 21 SLN positive for micrometastatic disease 9 additional lymph nodes were negative, ER 99%, PR 90%, HER-2 1+, Ki-67 12% Oncotype DX intermediate risk    Procedure   BRCA1 and 2 testing was done and it was negative   11/30/2006 - 04/01/2007 Chemotherapy   Taxotere Cytoxan x4    Radiation Therapy   Adjuvant radiation therapy   09/01/2007 - 07/13/2018 Anti-estrogen oral therapy   Tamoxifen from 2009 to 2011 then switched to Letrozole  2.5 mg daily    01/2022 -  Anti-estrogen oral therapy   Letrozole    03/21/2022 - 04/18/2022 Radiation Therapy   Site Technique Total Dose (Gy) Dose per Fx (Gy) Completed Fx Beam Energies  Breast, Right: Breast_R 3D 40.05/40.05 2.67 15/15 10XFFF  Breast, Right: Breast_R_Bst 3D 10/10 2 5/5 6X, 10X     Malignant neoplasm of upper-outer quadrant of right breast in female, estrogen receptor positive (HCC)  10/19/2021 Mammogram   Mammogram from February 2023 showed a possible mass as well as separate area of possible distortion in the right breast.  No suspicious findings in the left breast.  Right breast mammogram showed possible subtle distortion within the slightly outer right breast.  Suspicious 1 cm upper outer right breast mass.  Ultrasound-guided biopsy is recommended.  No abnormal appearing right axillary lymph nodes.   11/29/2021 Pathology Results   Pathology showed invasive ductal carcinoma, grade 2 along with DCIS in the right breast needle core biopsy at 11 o'clock position.  Biopsy from the right breast upper outer quadrant showed complex sclerosing lesion with usual ductal hyperplasia.  Fibrocystic changes with usual ductal hyperplasia.  Prognostics from the tumor show ER 100% positive strong  staining intensity, PR 100% positive, strong staining intensity, Ki-67 of 10% and HER2 equivocal, preliminary FISH was reported negative   12/03/2021 Initial Diagnosis   Malignant neoplasm of upper-outer quadrant of right breast in female, estrogen receptor positive (HCC)   12/08/2021 Cancer Staging   Staging form: Breast, AJCC 8th Edition - Clinical stage from 12/08/2021: Stage IA (cT1b, cN0, cM0, G2, ER+, PR+, HER2-) - Signed by Loretha Ash, MD on 12/08/2021 Stage prefix: Initial diagnosis Histologic grading system: 3 grade system   12/14/2021 Genetic Testing   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  Report date is December 14, 2021.   No other pathogenic variants detected in [facility] Panel.  The BRCAplus panel offered by [employer] and includes sequencing and deletion/duplication analysis for the following 8 genes: ATM, BRCA1, BRCA2, CDH1, CHEK2, PALB2, PTEN, and TP53. Results of pan-cancer panel are pending.     Oncotype testing   Oncotype was delayed because of insufficient sample.  This was however recent and the score resulted at 7.  Distant risk of recurrence at 9 years with antiestrogen therapy alone is 3%, no benefit from chemotherapy   01/13/2022 Surgery   She underwent right breast lumpectomy which showed focal atypical lobular hyperplasia, invasive and in situ ductal carcinoma measuring 1.4 cm, all surgical margins negative, no evidence of lymph node involvement.   01/2022 -  Anti-estrogen oral therapy   Letrozole    03/21/2022 - 04/18/2022 Radiation Therapy   Site Technique Total Dose (Gy) Dose per Fx (Gy) Completed Fx Beam Energies  Breast,  Right: Breast_R 3D 40.05/40.05 2.67 15/15 10XFFF  Breast, Right: Breast_R_Bst 3D 10/10 2 5/5 6X, 10X       INTERVAL HISTORY:   History of Present Illness    The patient, with a history of breast cancer and a CHEK2 mutation, presents for follow up.  Discussed the use of AI scribe software for clinical  note transcription with the patient, who gave verbal consent to proceed.  History of Present Illness Marilyn Hansen is a 68 year old female with breast cancer who presents with concerns about changes in scar tissue and a rash.  She has changes in the scar tissue in her armpit area, which is not painful but noticeable. There was a crusty discharge that occurred once since last evening. Additionally, there is a pink rash on the same side with a knot under it, causing her anxiety.  A recent MRI showed surgical changes with scattered fibroglandular tissue and mild enhancement, but no masses were identified. She continues to take letrozole  as part of her treatment regimen.  She describes an awareness in her kidney area, noting a history of a kidney stone last year. There is no pain, but she feels something is going on. She is able to perform all her daily activities without limitation.  She experiences hip pain and is concerned about whether this could be related to her cancer history or letrozole  use.  Rest of the pertinent 10 point ROS reviewed and negative  REVIEW OF SYSTEMS:  Review of Systems  Constitutional:  Negative for appetite change, chills, fatigue, fever and unexpected weight change.  HENT:   Negative for hearing loss, lump/mass, sore throat and trouble swallowing.   Eyes:  Negative for eye problems and icterus.  Respiratory:  Negative for chest tightness, cough, hemoptysis and shortness of breath.   Cardiovascular:  Negative for chest pain, leg swelling and palpitations.  Gastrointestinal:  Negative for abdominal distention, abdominal pain, constipation, diarrhea, nausea and vomiting.  Endocrine: Negative for hot flashes.  Genitourinary:  Negative for difficulty urinating.   Musculoskeletal:  Negative for arthralgias.  Skin:  Negative for itching and rash.  Neurological:  Negative for dizziness, extremity weakness, headaches and numbness.  Hematological:  Negative for adenopathy.  Does not bruise/bleed easily.  Psychiatric/Behavioral:  Negative for depression. The patient is not nervous/anxious.     PAST MEDICAL/SURGICAL HISTORY:  Past Medical History:  Diagnosis Date   Anxiety    Breast cancer (HCC) 08/2006   invasive breast cancer (T2, N1 ER+/PR+) BRCA 1/11 negative   Breast cancer (HCC) 12/2021   right breast IDC with DCIS   Depression    H/O left breast biopsy 09/2005   intraductal papilloma with atypical ductal hyperplasia   Hyperlipemia    Hypertension    Monoallelic mutation of CHEK2 gene in female patient 12/15/2021   Personal history of breast cancer 12/09/2021   Personal history of chemotherapy 2008   Personal history of radiation therapy 2008   PONV (postoperative nausea and vomiting)    Past Surgical History:  Procedure Laterality Date   AUGMENTATION MAMMAPLASTY Bilateral 2003   BILATERAL SALPINGOOPHORECTOMY  01/2009   l   BREAST BIOPSY  09/2005   breast excisional biopsy/chemo   BREAST BIOPSY  1/14 or 2/14   right-benign   BREAST ENHANCEMENT SURGERY  2003   saline   BREAST LUMPECTOMY Left 2008   BREAST LUMPECTOMY Right 01/13/2022   BREAST LUMPECTOMY WITH RADIOACTIVE SEED AND SENTINEL LYMPH NODE BIOPSY Right 01/13/2022   Procedure: RIGHT BREAST  LUMPECTOMY WITH RADIOACTIVE SEED X 2 AND SENTINEL LYMPH NODE BIOPSY;  Surgeon: Vernetta Berg, MD;  Location: [facility];  Service: General;  Laterality: Right;   EXTRACORPOREAL SHOCK WAVE LITHOTRIPSY Left 08/11/2022   Procedure: EXTRACORPOREAL SHOCK WAVE LITHOTRIPSY (ESWL);  Surgeon: Gaston Hamilton, MD;  Location: [facility];  Service: Urology;  Laterality: Left;   REDUCTION MAMMAPLASTY Bilateral 2009   WISDOM TOOTH EXTRACTION       ALLERGIES:  Allergies  Allergen Reactions   Dayquil [Pseudoephedrine-Apap-Dm] Hives   Lisinopril Cough     CURRENT MEDICATIONS:  Outpatient Encounter Medications as of 04/08/2024  Medication Sig Note   atorvastatin  (LIPITOR) 40 MG tablet Take 40 mg by mouth daily.    Cholecalciferol (VITAMIN D3) 1.25 MG (50000 UT) CAPS Take 50 mcg by mouth at bedtime.    escitalopram (LEXAPRO) 10 MG tablet 10 mg daily.    hydrochlorothiazide (HYDRODIURIL) 25 MG tablet Take 25 mg by mouth daily.  09/05/2016: Received from: External Pharmacy   letrozole  (FEMARA ) 2.5 MG tablet TAKE 1 TABLET BY MOUTH DAILY    traZODone  (DESYREL ) 50 MG tablet Take 50 mg by mouth at bedtime as needed for sleep. Takes 1/2 tab prn.    vitamin C (ASCORBIC ACID) 250 MG tablet Take 250 mg by mouth 2 (two) times daily. Gummies.    zinc gluconate 50 MG tablet Take 50 mg by mouth daily.    No facility-administered encounter medications on file as of 04/08/2024.     ONCOLOGIC FAMILY HISTORY:  Family History  Problem Relation Age of Onset   Anemia Mother    Osteoporosis Mother    Alcohol abuse Mother    Alzheimer's disease Father    Throat cancer Maternal Aunt        dx after 50   Hypertension Maternal Grandmother    Heart disease Maternal Grandmother    Hypertension Paternal Grandmother    Heart disease Paternal Grandmother     GENETIC COUNSELING/TESTING: negative  SOCIAL HISTORY:  Social History   Socioeconomic History   Marital status: Married    Spouse name: Not on file   Number of children: Not on file   Years of education: Not on file   Highest education level: Not on file  Occupational History   Not on file  Tobacco Use   Smoking status: Never   Smokeless tobacco: Never  Vaping Use   Vaping status: Never Used  Substance and Sexual Activity   Alcohol use: Not Currently    Comment: social   Drug use: No   Sexual activity: Not Currently    Partners: Male    Birth control/protection: Post-menopausal  Other Topics Concern   Not on file  Social History Narrative   Not on file   Social Drivers of Health   Financial Resource Strain: Not on file  Food Insecurity: Not on file  Transportation Needs: Not on file   Physical Activity: Not on file  Stress: Not on file  Social Connections: Not on file  Intimate Partner Violence: Not on file      OBJECTIVE:  BP (!) 139/59 (BP Location: Left Arm, Patient Position: Sitting, Cuff Size: Normal)   Pulse 76   Temp (!) 97.3 F (36.3 C) (Temporal)   Resp 19   Wt 188 lb 9.6 oz (85.5 kg)   LMP 11/21/2006   SpO2 94%   BMI 31.38 kg/m   Physical Exam Constitutional:      Appearance: Normal appearance.  Chest:  Comments: Bilateral breasts inspected. No palpable abnormality.  Both breasts otherwise with no palpable concerns.  No regional adenopathy. Musculoskeletal:        General: No swelling.     Cervical back: Normal range of motion and neck supple. No rigidity.  Lymphadenopathy:     Cervical: No cervical adenopathy.  Skin:    General: Skin is warm and dry.  Neurological:     Mental Status: She is alert.  Psychiatric:        Mood and Affect: Mood normal.    LABORATORY DATA:  None for this visit    ASSESSMENT AND PLAN:   Breast cancer of upper-outer quadrant of left female breast [facility]) Ms Martenson is a pleasant 68 y.o. female with history of Stage IIA left breast invasive ductal carcinoma, ER+/PR+/HER2-, diagnosed in 09/2006, treated with lumpectomy, adjuvant chemotherapy, adjuvant radiation therapy, and anti-estrogen therapy with Tamoxifen, followed by Letrozole  completed in 06/2018.     Given small tumor with favorable prognostics we have discussed about upfront surgery followed by Oncotype testing.  Since then she underwent right partial mastectomy on 01/13/2022, final pathology showing 1.4 cm invasive ductal carcinoma, all surgical margins negative, no evidence of lymph node involvement.  Oncotype resulted at 7, distant risk of recurrence at 9 years with antiestrogen therapy alone is 3%, no benefit from chemotherapy She completed adjuvant radiation and she is now on Femara  for adjuvant antiestrogen therapy.  Baseline bone density with  osteopenia.  She alternates between MRIs and mammograms given her CHEK2 pathogenic mutation.  Assessment and Plan Assessment & Plan Breast cancer, post-surgical, on letrozole  Post-surgical MRI showed scattered fibroglandular tissue with mild enhancement, no concern for recurrence. No recurrence on examination. Continues letrozole . - Continue letrozole . - Offer Guardant Reveal blood test for recurrence detection, not FDA approved, results in 1-2 weeks. - Provide pamphlet and instructions for Guardant Reveal blood test.  Skin rash of breast and axilla Non-painful, non-itchy rash in breast and axilla, superficial, not involving breast tissue. - This appears very benign and very superficial  Left groin pain Hip discomfort ? letrozole -induced, no significant pain or functional limitation. Continue to monitor.      Total time spent: 30 minutes including history, physical exam, review of records, counseling and coordination of care  Thank you for consulting us  the care of this patient.  Please not hesitate contact us  with any additional questions or concerns.  Amber Stalls MD

## 2024-04-08 NOTE — Assessment & Plan Note (Signed)
 Ms Clingenpeel is a pleasant 68 y.o. female with history of Stage IIA left breast invasive ductal carcinoma, ER+/PR+/HER2-, diagnosed in 09/2006, treated with lumpectomy, adjuvant chemotherapy, adjuvant radiation therapy, and anti-estrogen therapy with Tamoxifen, followed by Letrozole  completed in 06/2018.     Given small tumor with favorable prognostics we have discussed about upfront surgery followed by Oncotype testing.  Since then she underwent right partial mastectomy on 01/13/2022, final pathology showing 1.4 cm invasive ductal carcinoma, all surgical margins negative, no evidence of lymph node involvement.  Oncotype resulted at 7, distant risk of recurrence at 9 years with antiestrogen therapy alone is 3%, no benefit from chemotherapy She completed adjuvant radiation and she is now on Femara  for adjuvant antiestrogen therapy.  Baseline bone density with osteopenia.  She alternates between MRIs and mammograms given her CHEK2 pathogenic mutation.  Assessment and Plan Assessment & Plan Breast cancer, post-surgical, on letrozole  Post-surgical MRI showed scattered fibroglandular tissue with mild enhancement, no concern for recurrence. No recurrence on examination. Continues letrozole . - Continue letrozole . - Offer Guardant Reveal blood test for recurrence detection, not FDA approved, results in 1-2 weeks. - Provide pamphlet and instructions for Guardant Reveal blood test.  Skin rash of breast and axilla Non-painful, non-itchy rash in breast and axilla, superficial, not involving breast tissue. - This appears very benign and very superficial  Left groin pain Hip discomfort ? letrozole -induced, no significant pain or functional limitation. Continue to monitor.

## 2024-04-18 ENCOUNTER — Encounter: Payer: Self-pay | Admitting: Hematology and Oncology

## 2024-05-28 ENCOUNTER — Encounter: Payer: Self-pay | Admitting: Hematology and Oncology

## 2024-07-28 ENCOUNTER — Other Ambulatory Visit: Payer: Self-pay | Admitting: Hematology and Oncology

## 2024-08-08 ENCOUNTER — Ambulatory Visit: Payer: Medicare Other | Admitting: Hematology and Oncology

## 2024-10-11 ENCOUNTER — Inpatient Hospital Stay: Admitting: Hematology and Oncology

## 2024-10-23 ENCOUNTER — Encounter
